# Patient Record
Sex: Male | Born: 1985 | Race: Black or African American | Hispanic: No
Health system: Southern US, Community
[De-identification: ages and names within clinical notes are randomized; demographics above are authoritative.]

## PROBLEM LIST (undated history)

## (undated) DIAGNOSIS — Z973 Presence of spectacles and contact lenses: Secondary | ICD-10-CM

## (undated) DIAGNOSIS — K4090 Unilateral inguinal hernia, without obstruction or gangrene, not specified as recurrent: Secondary | ICD-10-CM

## (undated) DIAGNOSIS — F909 Attention-deficit hyperactivity disorder, unspecified type: Secondary | ICD-10-CM

## (undated) DIAGNOSIS — L309 Dermatitis, unspecified: Secondary | ICD-10-CM

## (undated) DIAGNOSIS — R519 Headache, unspecified: Secondary | ICD-10-CM

## (undated) HISTORY — DX: Presence of spectacles and contact lenses: Z97.3

## (undated) HISTORY — DX: Headache, unspecified: R51.9

---

## 2011-02-14 ENCOUNTER — Inpatient Hospital Stay (INDEPENDENT_AMBULATORY_CARE_PROVIDER_SITE_OTHER)
Admission: RE | Admit: 2011-02-14 | Discharge: 2011-02-14 | Disposition: A | Discharge status: Home / Self Care | Payer: BLUE CROSS/BLUE SHIELD | Source: Ambulatory Visit | Attending: Emergency Medicine | Admitting: Emergency Medicine

## 2011-02-14 DIAGNOSIS — L299 Pruritus, unspecified: Secondary | ICD-10-CM

## 2011-06-19 ENCOUNTER — Emergency Department (HOSPITAL_COMMUNITY): Payer: No Typology Code available for payment source

## 2011-06-19 ENCOUNTER — Emergency Department (HOSPITAL_COMMUNITY)
Admission: EM | Admit: 2011-06-19 | Discharge: 2011-06-20 | Disposition: A | Discharge status: Home / Self Care | Payer: No Typology Code available for payment source | Attending: Emergency Medicine | Admitting: Emergency Medicine

## 2011-06-19 DIAGNOSIS — Z79899 Other long term (current) drug therapy: Secondary | ICD-10-CM | POA: Insufficient documentation

## 2011-06-19 DIAGNOSIS — M62838 Other muscle spasm: Secondary | ICD-10-CM | POA: Insufficient documentation

## 2011-06-19 DIAGNOSIS — M542 Cervicalgia: Secondary | ICD-10-CM | POA: Insufficient documentation

## 2011-06-19 DIAGNOSIS — S9000XA Contusion of unspecified ankle, initial encounter: Secondary | ICD-10-CM | POA: Insufficient documentation

## 2011-06-19 DIAGNOSIS — S139XXA Sprain of joints and ligaments of unspecified parts of neck, initial encounter: Secondary | ICD-10-CM | POA: Insufficient documentation

## 2011-06-19 DIAGNOSIS — R51 Headache: Secondary | ICD-10-CM | POA: Insufficient documentation

## 2011-06-19 DIAGNOSIS — M25579 Pain in unspecified ankle and joints of unspecified foot: Secondary | ICD-10-CM | POA: Insufficient documentation

## 2011-06-25 ENCOUNTER — Inpatient Hospital Stay (INDEPENDENT_AMBULATORY_CARE_PROVIDER_SITE_OTHER)
Admission: RE | Admit: 2011-06-25 | Discharge: 2011-06-25 | Disposition: A | Discharge status: Home / Self Care | Payer: Self-pay | Source: Ambulatory Visit | Attending: Family Medicine | Admitting: Family Medicine

## 2011-06-25 DIAGNOSIS — S93409A Sprain of unspecified ligament of unspecified ankle, initial encounter: Secondary | ICD-10-CM

## 2012-09-21 ENCOUNTER — Emergency Department (HOSPITAL_COMMUNITY)
Admission: EM | Admit: 2012-09-21 | Discharge: 2012-09-21 | Disposition: A | Discharge status: Home / Self Care | Payer: Self-pay

## 2012-10-16 ENCOUNTER — Emergency Department (HOSPITAL_COMMUNITY)
Admission: EM | Admit: 2012-10-16 | Discharge: 2012-10-17 | Disposition: A | Discharge status: Home / Self Care | Payer: Worker's Compensation | Attending: Emergency Medicine | Admitting: Emergency Medicine

## 2012-10-16 ENCOUNTER — Encounter (HOSPITAL_COMMUNITY): Payer: Self-pay | Admitting: Emergency Medicine

## 2012-10-16 DIAGNOSIS — S7000XA Contusion of unspecified hip, initial encounter: Secondary | ICD-10-CM | POA: Insufficient documentation

## 2012-10-16 DIAGNOSIS — Z79899 Other long term (current) drug therapy: Secondary | ICD-10-CM | POA: Insufficient documentation

## 2012-10-16 DIAGNOSIS — W1800XA Striking against unspecified object with subsequent fall, initial encounter: Secondary | ICD-10-CM

## 2012-10-16 DIAGNOSIS — W1809XA Striking against other object with subsequent fall, initial encounter: Secondary | ICD-10-CM | POA: Insufficient documentation

## 2012-10-16 DIAGNOSIS — F909 Attention-deficit hyperactivity disorder, unspecified type: Secondary | ICD-10-CM | POA: Insufficient documentation

## 2012-10-16 DIAGNOSIS — S7001XA Contusion of right hip, initial encounter: Secondary | ICD-10-CM

## 2012-10-16 DIAGNOSIS — Y990 Civilian activity done for income or pay: Secondary | ICD-10-CM | POA: Insufficient documentation

## 2012-10-16 DIAGNOSIS — Y9289 Other specified places as the place of occurrence of the external cause: Secondary | ICD-10-CM | POA: Insufficient documentation

## 2012-10-16 HISTORY — DX: Attention-deficit hyperactivity disorder, unspecified type: F90.9
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
## 2012-10-16 NOTE — ED Notes (Signed)
Pt now in waiting room.  States he went to car to change shoes.

## 2012-10-16 NOTE — ED Notes (Signed)
Pt not answering.  Called in waiting room multiple times.

## 2012-10-16 NOTE — ED Notes (Signed)
No answer when called to be triaged 

## 2012-10-17 MED ORDER — IBUPROFEN 400 MG PO TABS
800.0000 mg | ORAL_TABLET | Freq: Once | ORAL | Status: AC
  Administered 2012-10-17: 800 mg via ORAL
  Filled 2012-10-17: qty 2

## 2012-10-17 NOTE — ED Provider Notes (Signed)
Medical screening examination/treatment/procedure(s) were performed by non-physician practitioner and as supervising physician I was immediately available for consultation/collaboration.    Vida Roller, MD 10/17/12 [phone]
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
## 2012-10-19 ENCOUNTER — Emergency Department (HOSPITAL_COMMUNITY)
Admission: EM | Admit: 2012-10-19 | Discharge: 2012-10-19 | Disposition: A | Discharge status: Home / Self Care | Payer: Worker's Compensation | Attending: Emergency Medicine | Admitting: Emergency Medicine

## 2012-10-19 ENCOUNTER — Encounter (HOSPITAL_COMMUNITY): Payer: Self-pay | Admitting: Emergency Medicine

## 2012-10-19 DIAGNOSIS — Z79899 Other long term (current) drug therapy: Secondary | ICD-10-CM | POA: Insufficient documentation

## 2012-10-19 DIAGNOSIS — F909 Attention-deficit hyperactivity disorder, unspecified type: Secondary | ICD-10-CM | POA: Insufficient documentation

## 2012-10-19 DIAGNOSIS — G44309 Post-traumatic headache, unspecified, not intractable: Secondary | ICD-10-CM | POA: Insufficient documentation

## 2012-10-19 DIAGNOSIS — W19XXXA Unspecified fall, initial encounter: Secondary | ICD-10-CM

## 2012-10-19 DIAGNOSIS — Z87828 Personal history of other (healed) physical injury and trauma: Secondary | ICD-10-CM | POA: Insufficient documentation

## 2012-10-19 DIAGNOSIS — Z9181 History of falling: Secondary | ICD-10-CM | POA: Insufficient documentation

## 2012-10-19 DIAGNOSIS — G8911 Acute pain due to trauma: Secondary | ICD-10-CM | POA: Insufficient documentation
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
## 2012-10-19 NOTE — ED Provider Notes (Signed)
History   This chart was scribed for Doug Sou, MD by Charolett Bumpers, ED Scribe. The patient was seen in room TR08C/TR08C. Patient's care was started at 0820.   CSN: [id number]  Arrival date & time 10/19/12  0803   First MD Initiated Contact with Patient 10/19/12 0820      No chief complaint on file.   The history is provided by the patient. No language interpreter was used.  Victor Stevens is a 26 y.o. male who presents to the Emergency Department complaining of constant, moderate neck pain with associated back pain and a headache. Neck pain is left-sided paracervical, nonradiating He states that he slipped and fell 3 days ago at work. He states that he was seen in the ED afterwards. He still complains of persistent, neck pain, back pain has resolved. and a headache since the fall, described as soreness. He states he was told to return for continued pain. He denies any other complaints at this time. He states he has taken Ibuprofen with mild relief. He denies any LOC. He denies tobacco, drug or alcohol use. He reports that his condition his overall improving gradually however he was asked to come back if he continues to have pain.  Past Medical History  Diagnosis Date  . ADHD (attention deficit hyperactivity disorder)     No past surgical history on file.  No family history on file.  History  Substance Use Topics  . Smoking status: Never Smoker   . Smokeless tobacco: Not on file  . Alcohol Use: No      Review of Systems  Constitutional: Negative.   HENT: Positive for neck pain.   Respiratory: Negative.   Cardiovascular: Negative.   Gastrointestinal: Negative.   Skin: Negative.   Neurological: Positive for headaches.  Hematological: Negative.   Psychiatric/Behavioral: Negative.   All other systems reviewed and are negative.    Allergies  Other  Home Medications   Current Outpatient Rx  Name  Route  Sig  Dispense  Refill  .  AMPHETAMINE-DEXTROAMPHETAMINE 5 MG PO TABS   Oral   Take 5 mg by mouth 2 (two) times daily.         . IBUPROFEN 200 MG PO TABS   Oral   Take 400-600 mg by mouth every 6 (six) hours as needed. For pain           There were no vitals taken for this visit.  Physical Exam  Nursing note and vitals reviewed. Constitutional: He is oriented to person, place, and time. He appears well-developed and well-nourished.  HENT:  Head: Normocephalic and atraumatic.  Eyes: Conjunctivae normal are normal. Pupils are equal, round, and reactive to light.  Neck: Neck supple. No tracheal deviation present. No thyromegaly present.  Cardiovascular: Normal rate and regular rhythm.   No murmur heard. Pulmonary/Chest: Effort normal and breath sounds normal.  Abdominal: Soft. Bowel sounds are normal. He exhibits no distension. There is no tenderness.  Musculoskeletal: Normal range of motion. He exhibits no edema and no tenderness.       Entire spine nontender  Neurological: He is alert and oriented to person, place, and time. He has normal reflexes. Coordination normal.       Gaitnormal  Skin: Skin is warm and dry. No rash noted.  Psychiatric: He has a normal mood and affect.    ED Course  Procedures (including critical care time)  DIAGNOSTIC STUDIES: Oxygen Saturation is 97% on room air, adequate by my interpretation.  COORDINATION OF CARE:  08:25-Discussed planned course of treatment with the patient including continuing with Ibuprofen, and Tylenol, who is agreeable at this time. Pt drove himself to ED.     Labs Reviewed - No data to display No results found.   No diagnosis found.    MDM  X-rays not indicated discussed with patient patient agrees. Patient is experiencing soreness from fall. He can continue to take ibuprofen or Tylenol as needed. Note for no lifting 3 days more than 10 pounds. He should follow up with his workman's comp clinic if continued pain after 3 days prior to  return to work full duty Diagnosis #1 fall #2 neck pain #3 back pain #4 minor closed head injury    I personally performed the services described in this documentation, which was scribed in my presence. The recorded information has been reviewed and is accurate.     Doug Sou, MD 10/19/12 [phone]
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
## 2014-02-07 ENCOUNTER — Emergency Department (HOSPITAL_COMMUNITY)
Admission: EM | Admit: 2014-02-07 | Discharge: 2014-02-07 | Disposition: A | Discharge status: Home / Self Care | Payer: BC Managed Care – PPO | Source: Home / Self Care | Attending: Emergency Medicine | Admitting: Emergency Medicine

## 2014-02-07 ENCOUNTER — Other Ambulatory Visit (HOSPITAL_COMMUNITY)
Admission: RE | Admit: 2014-02-07 | Discharge: 2014-02-07 | Disposition: A | Discharge status: Home / Self Care | Payer: BC Managed Care – PPO | Source: Ambulatory Visit | Attending: Emergency Medicine | Admitting: Emergency Medicine

## 2014-02-07 ENCOUNTER — Encounter (HOSPITAL_COMMUNITY): Payer: Self-pay | Admitting: Emergency Medicine

## 2014-02-07 DIAGNOSIS — Q5310 Unspecified undescended testicle, unilateral: Secondary | ICD-10-CM

## 2014-02-07 DIAGNOSIS — N342 Other urethritis: Secondary | ICD-10-CM

## 2014-02-07 DIAGNOSIS — N453 Epididymo-orchitis: Secondary | ICD-10-CM

## 2014-02-07 DIAGNOSIS — N451 Epididymitis: Secondary | ICD-10-CM

## 2014-02-07 DIAGNOSIS — Q539 Undescended testicle, unspecified: Secondary | ICD-10-CM

## 2014-02-07 DIAGNOSIS — Z113 Encounter for screening for infections with a predominantly sexual mode of transmission: Secondary | ICD-10-CM | POA: Insufficient documentation

## 2014-02-07 LAB — POCT URINALYSIS DIP (DEVICE)
Bilirubin Urine: NEGATIVE
GLUCOSE, UA: NEGATIVE mg/dL
HGB URINE DIPSTICK: NEGATIVE
Ketones, ur: NEGATIVE mg/dL
NITRITE: NEGATIVE
PH: 7 (ref 5.0–8.0)
Protein, ur: NEGATIVE mg/dL
SPECIFIC GRAVITY, URINE: 1.025 (ref 1.005–1.030)
UROBILINOGEN UA: 1 mg/dL (ref 0.0–1.0)

## 2014-02-07 LAB — RPR: RPR Ser Ql: NONREACTIVE

## 2014-02-07 MED ORDER — LIDOCAINE HCL (PF) 1 % IJ SOLN
INTRAMUSCULAR | Status: AC
  Filled 2014-02-07: qty 5

## 2014-02-07 MED ORDER — CEFTRIAXONE SODIUM 250 MG IJ SOLR
INTRAMUSCULAR | Status: AC
  Filled 2014-02-07: qty 250

## 2014-02-07 MED ORDER — CEFTRIAXONE SODIUM 250 MG IJ SOLR
250.0000 mg | Freq: Once | INTRAMUSCULAR | Status: AC
  Administered 2014-02-07: 250 mg via INTRAMUSCULAR

## 2014-02-07 MED ORDER — DOXYCYCLINE HYCLATE 100 MG PO CAPS: 100.0000 mg | ORAL_CAPSULE | Freq: Two times a day (BID) | ORAL | Status: DC

## 2014-02-07 NOTE — ED Notes (Signed)
Reports penile discharge since Saturday.  Denies pelvic or abdominal pain.

## 2014-02-07 NOTE — ED Notes (Signed)
Pt given injection will discharge at 9:00 a.m

## 2014-02-07 NOTE — Discharge Instructions (Signed)
Undescended Testicle Undescended testicles (cryptorchidism) is the absence of one or both testicles from the scrotum. During development, the testicles of a male fetus form inside the abdomen. At about 28 weeks of gestation, the testicles descend from the abdomen, through a tube-like space between the muscles in the groin (inguinal canal), into the scrotum. Sometimes the testicles do not descend or only descend into the inguinal canal but not the scrotum (partially descended). Most of the time undescended testicles will descend within the first 4 months after birth. CAUSES Many things can cause testicles to not descend, including:  Decreased pressure in the abdomen.  Abnormal string that pulls the testis down.  Hormone abnormalities.  Scarring in the descent tube.  Abnormal muscle pull.  Abnormalities in the testicles and cord structures. RISK FACTORS Undescended testicles can be associated with:  Hernias.  Water sacs in the scrotum.  Abnormal development of the penis.  Cerebral palsy.  Mental retardation.  Down syndrome.  Tumors of the kidney. DIAGNOSIS  Undescended testicles are diagnosed by a physical exam.  TREATMENT  Treatment is important to decrease the chance of infertility. Sperm production can begin as early as 4212 months of age, so it is recommended that treatment occur by 28 year of age. Hormones can also be used to stimulate the testicles to descend into the scrotum. Sometimes surgery is required.  Document Released: 04/27/2003 Document Revised: 06/23/2013 Document Reviewed: 04/12/2013 [facility] Patient Information 2014 [place].   Sexually Transmitted Disease A sexually transmitted disease (STD) is a disease or infection that may be passed (transmitted) from person to person, usually during sexual activity. This may happen by way of saliva, semen, blood, vaginal mucus, or urine. Common STDs include:   Gonorrhea.   Chlamydia.   Syphilis.   HIV and  AIDS.   Genital herpes.   Hepatitis B and C.   Trichomonas.   Human papillomavirus (HPV).   Pubic lice.   Scabies.  Mites.  Bacterial vaginosis. WHAT ARE CAUSES OF STDs? An STD may be caused by bacteria, a virus, or parasites. STDs are often transmitted during sexual activity if one person is infected. However, they may also be transmitted through nonsexual means. STDs may be transmitted after:   Sexual intercourse with an infected person.   Sharing sex toys with an infected person.   Sharing needles with an infected person or using unclean piercing or tattoo needles.  Having intimate contact with the genitals, mouth, or rectal areas of an infected person.   Exposure to infected fluids during birth. WHAT ARE THE SIGNS AND SYMPTOMS OF STDs? Different STDs have different symptoms. Some people may not have any symptoms. If symptoms are present, they may include:   Painful or bloody urination.   Pain in the pelvis, abdomen, vagina, anus, throat, or eyes.   Skin rash, itching, irritation, growths, sores (lesions), ulcerations, or warts in the genital or anal area.  Abnormal vaginal discharge with or without bad odor.   Penile discharge in men.   Fever.   Pain or bleeding during sexual intercourse.   Swollen glands in the groin area.   Yellow skin and eyes (jaundice). This is seen with hepatitis.   Swollen testicles.  Infertility.  Sores and blisters in the mouth. HOW ARE STDs DIAGNOSED? To make a diagnosis, your health care provider may:   Take a medical history.   Perform a physical exam.   Take a sample of any discharge for examination.  Swab the throat, cervix, opening to the  penis, rectum, or vagina for testing.  Test a sample of your first morning urine.   Perform blood tests.   Perform a Pap smear, if this applies.   Perform a colposcopy.   Perform a laparoscopy.  HOW ARE STDs TREATED? Treatment depends on the STD.  Some STDs may be treated but not cured.   Chlamydia, gonorrhea, trichomonas, and syphilis can be cured with antibiotics.   Genital herpes, hepatitis, and HIV can be treated, but not cured, with prescribed medicines. The medicines lessen symptoms.   Genital warts from HPV can be treated with medicine or by freezing, burning (electrocautery), or surgery. Warts may come back.   HPV cannot be cured with medicine or surgery. However, abnormal areas may be removed from the cervix, vagina, or vulva.   If your diagnosis is confirmed, your recent sexual partners need treatment. This is true even if they are symptom-free or have a negative culture or evaluation. They should not have sex until their health care providers say it is OK. HOW CAN I REDUCE MY RISK OF GETTING AN STD?  Use latex condoms, dental dams, and water-soluble lubricants during sexual activity. Do not use petroleum jelly or oils.  Get vaccinated for HPV and hepatitis. If you have not received these vaccines in the past, talk to your health care provider about whether one or both might be right for you.   Avoid risky sex practices that can break the skin.  WHAT SHOULD I DO IF I THINK I HAVE AN STD?  See your health care provider.   Inform all sexual partners. They should be tested and treated for any STDs.  Do not have sex until your health care provider says it is OK. WHEN SHOULD I GET HELP? Seek immediate medical care if:  You develop severe abdominal pain.  You are a man and notice swelling or pain in the testicles.  You are a woman and notice swelling or pain in your vagina. Document Released: 01/11/2003 Document Revised: 08/11/2013 Document Reviewed: 05/11/2013 [facility] Patient Information 2014 [place], [place].  Urethritis, Adult Urethritis is an inflammation of the tube through which urine exits your bladder (urethra).  CAUSES Urethritis is often caused by an infection in your urethra. The infection can be  viral, like herpes. The infection can also be bacterial, like gonorrhea. RISK FACTORS Risk factors of urethritis include:  Having sex without using a condom.  Having multiple sexual partners.  Having poor hygiene. SIGNS AND SYMPTOMS Symptoms of urethritis are less noticeable in women than in men. These symptoms include:  Burning feeling when you urinate (dysuria).  Discharge from your urethra.  Blood in your urine (hematuria).  Urinating more than usual. DIAGNOSIS  To confirm a diagnosis of urethritis, your health care provider will do the following:  Ask about your sexual history.  Perform a physical exam.  Have you provide a sample of your urine for lab testing.  Use a cotton swab to gently collect a sample from your urethra for lab testing. TREATMENT  It is important to treat urethritis. Depending on the cause, untreated urethritis may lead to serious genital infections and possibly infertility. Urethritis caused by a bacterial infection is treated with antibiotics. All sexual partners must be treated.  HOME CARE INSTRUCTIONS  Do not have sex until the test results are known and treatment is completed, even if your symptoms go away before you finish treatment.  Finish all medicines that you are prescribed. SEEK MEDICAL CARE IF:   Your symptoms  are not improved in 3 days.  Your symptoms are getting worse.  You develop abdominal pain or pelvic pain (in women).  You develop joint pain. SEEK IMMEDIATE MEDICAL CARE IF:   You have a fever with a temperature of 101.26F (38.8C) or greater.  You have severe pain in the belly, back, or side.  You have repeated vomiting. Document Released: 04/16/2001 Document Revised: 08/11/2013 Document Reviewed: 06/21/2013 [facility] Patient Information 2014 [place], [place].

## 2014-02-07 NOTE — ED Provider Notes (Signed)
Medical screening examination/treatment/procedure(s) were performed by non-physician practitioner and as supervising physician I was immediately available for consultation/collaboration.  Leslee Homeavid Tamera Pingley, M.D.  Reuben Likesavid C Jamesa Tedrick, MD 02/07/14 [phone]

## 2014-02-07 NOTE — ED Provider Notes (Signed)
CSN: [id number]     Arrival date & time 02/07/14  0805 History   First MD Initiated Contact with Patient 02/07/14 0825     Chief Complaint  Patient presents with  . Penile Discharge   (Consider location/radiation/quality/duration/timing/severity/associated sxs/prior Treatment) HPI Comments: 28 year old male presents complaining of penile discharge for 2 days. He describes this as milky white. He denies any testicle pain or dysuria. He admits to risk factors for STDs. The discharge is worse in the morning. He denies any other symptoms.  Patient is a 28 y.o. male presenting with penile discharge.  Penile Discharge Pertinent negatives include no abdominal pain.    Past Medical History  Diagnosis Date  . ADHD (attention deficit hyperactivity disorder)    History reviewed. No pertinent past surgical history. History reviewed. No pertinent family history. History  Substance Use Topics  . Smoking status: Never Smoker   . Smokeless tobacco: Not on file  . Alcohol Use: No    Review of Systems  Constitutional: Negative for fever and chills.  Gastrointestinal: Negative for abdominal pain.  Genitourinary: Positive for discharge. Negative for dysuria and testicular pain.  Hematological: Negative for adenopathy.  All other systems reviewed and are negative.    Allergies  Other  Home Medications   Current Outpatient Rx  Name  Route  Sig  Dispense  Refill  . amphetamine-dextroamphetamine (ADDERALL) 5 MG tablet   Oral   Take 5 mg by mouth 2 (two) times daily.         Marland Kitchen doxycycline (VIBRAMYCIN) 100 MG capsule   Oral   Take 1 capsule (100 mg total) by mouth 2 (two) times daily.   14 capsule   0   . ibuprofen (ADVIL,MOTRIN) 200 MG tablet   Oral   Take 400-600 mg by mouth every 6 (six) hours as needed. For pain          BP 125/53  Pulse 70  Temp(Src) 98.2 F (36.8 C) (Oral)  Resp 20  SpO2 97% Physical Exam  Nursing note and vitals reviewed. Constitutional: He is  oriented to person, place, and time. He appears well-developed and well-nourished. No distress.  HENT:  Head: Normocephalic.  Pulmonary/Chest: Effort normal. No respiratory distress.  Genitourinary: Right testis shows tenderness (minimal tenderness at the epididymis). Right testis is descended. 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 No penile tenderness. Discharge found.  Lymphadenopathy:       Right: Inguinal adenopathy present.       Left: Inguinal adenopathy present.  Neurological: He is alert and oriented to person, place, and time. Coordination normal.  Skin: Skin is warm and dry. No rash noted. He is not diaphoretic.  Psychiatric: He has a normal mood and affect. Judgment normal.    ED Course  Procedures (including critical care time) Labs Review Labs Reviewed  POCT URINALYSIS DIP (DEVICE) - Abnormal; Notable for the following:    Leukocytes, UA SMALL (*)    All other components within normal limits  URINE CULTURE  RPR  HIV ANTIBODY (ROUTINE TESTING)  URINE CYTOLOGY ANCILLARY ONLY   Imaging Review No results found.   MDM   1. Urethritis   2. Epididymitis   3. Cryptorchidism, unilateral    Treating for STDs/epididymitis with Turner 50 mg of ceftriaxone here followed by doxycycline twice a day for a week.  With regard to the undescended testicle, he says he has been told in the past by his pediatrician that this is not a big deal and he shouldn't worry about. He needs  to have this evaluated and fixed if necessary in order to decrease risk of infertility or testicular cancer. I will refer him to urology for evaluation.  Meds ordered this encounter  Medications  . cefTRIAXone (ROCEPHIN) injection 250 mg    Sig:   . doxycycline (VIBRAMYCIN) 100 MG capsule    Sig: Take 1 capsule (100 mg total) by mouth 2 (two) times daily.    Dispense:  14 capsule    Refill:  0    Order Specific Question:  Supervising Provider    Answer:  Lorenz CoasterKELLER, DAVID C [[id number]]       Graylon GoodZachary H Saren Corkern,  PA-C 02/07/14 [phone]

## 2014-02-08 LAB — URINE CYTOLOGY ANCILLARY ONLY
Chlamydia: NEGATIVE
Neisseria Gonorrhea: POSITIVE — AB
TRICH (WINDOWPATH): NEGATIVE

## 2014-02-08 LAB — URINE CULTURE
COLONY COUNT: NO GROWTH
Culture: NO GROWTH

## 2014-02-09 ENCOUNTER — Telehealth (HOSPITAL_COMMUNITY): Payer: Self-pay | Admitting: *Deleted

## 2014-02-09 NOTE — ED Notes (Addendum)
GC pos., Chlamydia and Trich neg., RPR non-reactive, HIV pending, Urine culture: No growth. I called pt. Pt. verified x 2 and given results.  Pt. told he was adequately treated with Rocephin and Doxycycline and to finish all of his antibiotics.  I told him I would only call back if HIV is pos.  Pt. instructed to notify his partner, no sex for 1 week and to practice safe sex. Pt. told [facility] can [facility] by [facility]. Pt. voiced understanding.  [facility] Vassie MoselleSuzanne M Dick Hark 02/09/2014 I called [facility]. I told her that the HIV shows in process.  She had a final result and she faxed it me.  It was non-reactive.  02/10/2014

## 2014-02-10 LAB — HIV ANTIBODY (ROUTINE TESTING W REFLEX): HIV: NONREACTIVE

## 2014-11-17 ENCOUNTER — Other Ambulatory Visit (HOSPITAL_COMMUNITY)
Admission: RE | Admit: 2014-11-17 | Discharge: 2014-11-17 | Disposition: A | Discharge status: Home / Self Care | Payer: BLUE CROSS/BLUE SHIELD | Source: Ambulatory Visit | Attending: Family Medicine | Admitting: Family Medicine

## 2014-11-17 ENCOUNTER — Encounter (HOSPITAL_COMMUNITY): Payer: Self-pay | Admitting: Emergency Medicine

## 2014-11-17 ENCOUNTER — Emergency Department (HOSPITAL_COMMUNITY)
Admission: EM | Admit: 2014-11-17 | Discharge: 2014-11-17 | Disposition: A | Discharge status: Home / Self Care | Payer: BLUE CROSS/BLUE SHIELD | Source: Home / Self Care | Attending: Family Medicine | Admitting: Family Medicine

## 2014-11-17 DIAGNOSIS — Z113 Encounter for screening for infections with a predominantly sexual mode of transmission: Secondary | ICD-10-CM | POA: Diagnosis present

## 2014-11-17 DIAGNOSIS — N342 Other urethritis: Secondary | ICD-10-CM

## 2014-11-17 DIAGNOSIS — N5089 Other specified disorders of the male genital organs: Secondary | ICD-10-CM

## 2014-11-17 DIAGNOSIS — N508 Other specified disorders of male genital organs: Secondary | ICD-10-CM

## 2014-11-17 MED ORDER — AZITHROMYCIN 250 MG PO TABS
ORAL_TABLET | ORAL | Status: AC
  Filled 2014-11-17: qty 4

## 2014-11-17 MED ORDER — AZITHROMYCIN 250 MG PO TABS
1000.0000 mg | ORAL_TABLET | Freq: Every day | ORAL | Status: DC
  Administered 2014-11-17: 1000 mg via ORAL

## 2014-11-17 MED ORDER — CEFTRIAXONE SODIUM 250 MG IJ SOLR
INTRAMUSCULAR | Status: AC
  Filled 2014-11-17: qty 250

## 2014-11-17 MED ORDER — CEFTRIAXONE SODIUM 250 MG IJ SOLR
250.0000 mg | Freq: Once | INTRAMUSCULAR | Status: AC
  Administered 2014-11-17: 250 mg via INTRAMUSCULAR

## 2014-11-17 MED ORDER — LIDOCAINE HCL (PF) 1 % IJ SOLN
INTRAMUSCULAR | Status: AC
  Filled 2014-11-17: qty 5

## 2014-11-17 NOTE — ED Provider Notes (Signed)
CSN: [id number]     Arrival date & time 11/17/14  1236 History   First MD Initiated Contact with Patient 11/17/14 1310     Chief Complaint  Patient presents with  . Exposure to STD   (Consider location/radiation/quality/duration/timing/severity/associated sxs/prior Treatment) HPI           29 year old male presents complaining of possible STD. He has burning with urination and penile discharge for a few days. Denies testicular pain, abdominal pain, fever, chills. He admits to possible STD exposure  Past Medical History  Diagnosis Date  . ADHD (attention deficit hyperactivity disorder)    History reviewed. No pertinent past surgical history. History reviewed. No pertinent family history. History  Substance Use Topics  . Smoking status: Never Smoker   . Smokeless tobacco: Not on file  . Alcohol Use: Yes     Comment: once a year    Review of Systems  Constitutional: Negative for fever and chills.  Genitourinary: Positive for dysuria and discharge. Negative for penile swelling, genital sores and testicular pain.  All other systems reviewed and are negative.   Allergies  Other  Home Medications   Prior to Admission medications   Medication Sig Start Date End Date Taking? Authorizing Provider  amphetamine-dextroamphetamine (ADDERALL) 5 MG tablet Take 5 mg by mouth 2 (two) times daily.    Historical Provider, MD  doxycycline (VIBRAMYCIN) 100 MG capsule Take 1 capsule (100 mg total) by mouth 2 (two) times daily. 02/07/14   Adrian BlackwaterZachary H Yanuel Tagg, PA-C  ibuprofen (ADVIL,MOTRIN) 200 MG tablet Take 400-600 mg by mouth every 6 (six) hours as needed. For pain    Historical Provider, MD   BP 134/80 mmHg  Pulse 80  Temp(Src) 98.6 F (37 C) (Oral)  Resp 14  SpO2 100% Physical Exam  Constitutional: He is oriented to person, place, and time. He appears well-developed and well-nourished. No distress.  HENT:  Head: Normocephalic.  Pulmonary/Chest: Effort normal. No respiratory distress.   Abdominal: A hernia is present. Hernia confirmed positive in the left inguinal area (large, indirect).  Genitourinary: Right testis shows no swelling. Left testis shows mass (large scrotal mass, possibly indicating an indirect hernia) and swelling. Discharge found.  Lymphadenopathy:       Right: No inguinal adenopathy present.       Left: No inguinal adenopathy present.  Neurological: He is alert and oriented to person, place, and time. Coordination normal.  Skin: Skin is warm and dry. No rash noted. He is not diaphoretic.  Psychiatric: He has a normal mood and affect. Judgment normal.  Nursing note and vitals reviewed.   ED Course  Procedures (including critical care time) Labs Review Labs Reviewed  CYTOLOGY, (ORAL, ANAL, URETHRAL) ANCILLARY ONLY    Imaging Review No results found.   MDM   1. Urethritis   2. Scrotal mass    Cytology swab obtained.  Treated with rocephin and azithromycin.  F/u PRN   Meds ordered this encounter  Medications  . cefTRIAXone (ROCEPHIN) injection 250 mg    Sig:   . azithromycin (ZITHROMAX) tablet 1,000 mg    Sig:        Graylon GoodZachary H Bradley [place], PA-C 11/17/14 1438

## 2014-11-17 NOTE — ED Notes (Signed)
Pt states that he believes he was exposed to a STD

## 2014-11-17 NOTE — ED Notes (Signed)
Patient refused the blood tests for RPR & HIV

## 2014-11-17 NOTE — ED Notes (Signed)
Patient voided prior to coming to the exam room. Patient can't provide any specimens at this time.

## 2014-11-17 NOTE — Discharge Instructions (Signed)
Follow-up with your primary care provider for evaluation of the left scrotal mass   Urethritis Urethritis is an inflammation of the tube through which urine exits your bladder (urethra).  CAUSES Urethritis is often caused by an infection in your urethra. The infection can be viral, like herpes. The infection can also be bacterial, like gonorrhea. RISK FACTORS Risk factors of urethritis include:  Having sex without using a condom.  Having multiple sexual partners.  Having poor hygiene. SIGNS AND SYMPTOMS Symptoms of urethritis are less noticeable in women than in men. These symptoms include:  Burning feeling when you urinate (dysuria).  Discharge from your urethra.  Blood in your urine (hematuria).  Urinating more than usual. DIAGNOSIS  To confirm a diagnosis of urethritis, your health care provider will do the following:  Ask about your sexual history.  Perform a physical exam.  Have you provide a sample of your urine for lab testing.  Use a cotton swab to gently collect a sample from your urethra for lab testing. TREATMENT  It is important to treat urethritis. Depending on the cause, untreated urethritis may lead to serious genital infections and possibly infertility. Urethritis caused by a bacterial infection is treated with antibiotic medicine. All sexual partners must be treated.  HOME CARE INSTRUCTIONS  Do not have sex until the test results are known and treatment is completed, even if your symptoms go away before you finish treatment.  If you were prescribed an antibiotic, finish it all even if you start to feel better. SEEK MEDICAL CARE IF:   Your symptoms are not improved in 3 days.  Your symptoms are getting worse.  You develop abdominal pain or pelvic pain (in women).  You develop joint pain.  You have a fever. SEEK IMMEDIATE MEDICAL CARE IF:   You have severe pain in the belly, back, or side.  You have repeated vomiting. MAKE SURE  YOU:  Understand these instructions.  Will watch your condition.  Will get help right away if you are not doing well or get worse. Document Released: 04/16/2001 Document Revised: 03/07/2014 Document Reviewed: 06/21/2013 [facility] Patient Information 2015 [employer], [place]. This information is not intended to replace advice given to you by your health care provider. Make sure you discuss any questions you have with your health care provider.

## 2014-11-18 LAB — CYTOLOGY, (ORAL, ANAL, URETHRAL) ANCILLARY ONLY
CHLAMYDIA, DNA PROBE: NEGATIVE
NEISSERIA GONORRHEA: POSITIVE — AB
Trichomonas: NEGATIVE

## 2014-11-20 ENCOUNTER — Telehealth (HOSPITAL_COMMUNITY): Payer: Self-pay | Admitting: *Deleted

## 2014-11-23 NOTE — ED Notes (Addendum)
GC pos., Chlamydia and Trich neg.  Pt. adequately treated with Rocephin and Zithromax. I called pt. and left a message to call. Call 1.  [facility]. 11/20/2014 Left message.  Call 2. 11/23/2014 Left message.  Call 3. 11/24/2014 Unable to reach pt. by phone x 4.  Confidential marked letter sent with results and instructions.   Vassie MoselleYork, Andrik Sandt M 11/28/2014

## 2014-12-21 ENCOUNTER — Ambulatory Visit (INDEPENDENT_AMBULATORY_CARE_PROVIDER_SITE_OTHER): Payer: BLUE CROSS/BLUE SHIELD | Admitting: Physician Assistant

## 2014-12-21 VITALS — BP 110/82 | HR 68 | Temp 97.8°F | Resp 18 | Ht 65.0 in | Wt 168.0 lb

## 2014-12-21 DIAGNOSIS — R369 Urethral discharge, unspecified: Secondary | ICD-10-CM

## 2014-12-21 LAB — POCT UA - MICROSCOPIC ONLY
Bacteria, U Microscopic: NEGATIVE
Casts, Ur, LPF, POC: NEGATIVE
Crystals, Ur, HPF, POC: NEGATIVE
EPITHELIAL CELLS, URINE PER MICROSCOPY: NEGATIVE
MUCUS UA: NEGATIVE
RBC, URINE, MICROSCOPIC: NEGATIVE
YEAST UA: NEGATIVE

## 2014-12-21 LAB — POCT URINALYSIS DIPSTICK
BILIRUBIN UA: NEGATIVE
GLUCOSE UA: NEGATIVE
Ketones, UA: NEGATIVE
NITRITE UA: NEGATIVE
Protein, UA: NEGATIVE
RBC UA: NEGATIVE
SPEC GRAV UA: 1.02
UROBILINOGEN UA: 1
pH, UA: 7.5

## 2014-12-21 MED ORDER — AZITHROMYCIN 250 MG PO TABS: ORAL_TABLET | ORAL | Status: DC

## 2014-12-21 MED ORDER — CEFTRIAXONE SODIUM 1 G IJ SOLR
250.0000 mg | Freq: Once | INTRAMUSCULAR | Status: AC
  Administered 2014-12-21: 250 mg via INTRAMUSCULAR

## 2014-12-21 NOTE — Progress Notes (Signed)
   Subjective:    Patient ID: Victor JarvisJames Stevens, male    DOB: 01/01/1986, 29 y.o.   MRN: [id number]  Chief Complaint  Patient presents with  . std check  . Penile Discharge    x3 weeks    Prior to Admission medications   Medication Sig Start Date End Date Taking? Authorizing Provider  ibuprofen (ADVIL,MOTRIN) 200 MG tablet Take 400-600 mg by mouth every 6 (six) hours as needed. For pain   Yes Historical Provider, MD  amphetamine-dextroamphetamine (ADDERALL) 5 MG tablet Take 5 mg by mouth 2 (two) times daily.    Historical Provider, MD  azithromycin (ZITHROMAX) 250 MG tablet Please take all 4 tabs at one time. 12/21/14   Raelyn Ensignodd Tyheim Vanalstyne, PA   Medications, allergies, past medical history, surgical history, family history, social history and problem list reviewed and updated.  HPI  [age] yom with no pertinent pmh presents with white penile dc since yest.   Has hx gonorrhea infx one month ago, was tx at that time and sx resolved. Returns today with white dc that started last night. Denies dysuria, hematuria, increased freq, urgency.   He has had two male partners in the past few months, engaging in only oral sex with each partner. Oral sex has been unprotected. Last encounter was approx 10 days ago.   He tested negative for HIV/syphilis in 02/2104. Has never had Hep C testing.   Review of Systems No cp, sob, fever, chills.     Objective:   Physical Exam  Constitutional: He is oriented to person, place, and time. He appears well-developed and well-nourished.  Non-toxic appearance. He does not have a sickly appearance. He does not appear ill. No distress.  BP 110/82 mmHg  Pulse 68  Temp(Src) 97.8 F (36.6 C) (Oral)  Resp 18  Ht 5\' 5"  (1.651 m)  Wt 168 lb (76.204 kg)  BMI 27.96 kg/m2  SpO2 97%   Genitourinary: Discharge found.  Small amnt white dc from penile tip.   Neurological: He is alert and oriented to person, place, and time.  Psychiatric: He has a normal mood and affect. His speech  is normal and behavior is normal.      Assessment & Plan:   [age] yom with no pertinent pmh presents with white penile dc since yest.   Penile discharge - Plan: POCT UA - Microscopic Only, POCT urinalysis dipstick, GC/Chlamydia Probe Amp, cefTRIAXone (ROCEPHIN) injection 250 mg, azithromycin (ZITHROMAX) 250 MG tablet --presumptive tx for gc/ct --250 IM ceft today, 1g azithro sent to pharm --rtc 3 days if sx not resolved --rtc 3 months for recheck --pt declines hiv/rpr/hep c testing today --if uriprobe returns positive will contact health dept and inform pt to inform his partners  Donnajean Lopesodd M. Jahzeel Poythress, PA-C Physician Assistant-Certified [facility]  12/21/2014 11:06 AM

## 2014-12-21 NOTE — Patient Instructions (Signed)
We are testing you today for gonorrhea, chlamydia, and a urinary tract infection. I will let you know the results of those. We will treat you today as if you have those stds. You got one antibiotic shot in clinic. Stop by the pharmacy to pick up the other antibiotic, take all 4 pills at once.  If you change your mind about HIV/syphilis/Hepatitis C testing let us know. If you're symptoms aren't gone in 3 days please come back to clinic. Please come back to clinic in 3 months for recheck.

## 2014-12-22 LAB — GC/CHLAMYDIA PROBE AMP
CT Probe RNA: NEGATIVE
GC Probe RNA: POSITIVE — AB

## 2016-11-05 ENCOUNTER — Ambulatory Visit (HOSPITAL_COMMUNITY)
Admission: EM | Admit: 2016-11-05 | Discharge: 2016-11-05 | Disposition: A | Discharge status: Home / Self Care | Payer: BLUE CROSS/BLUE SHIELD | Attending: Family Medicine | Admitting: Family Medicine

## 2016-11-05 ENCOUNTER — Encounter (HOSPITAL_COMMUNITY): Payer: Self-pay | Admitting: Family Medicine

## 2016-11-05 DIAGNOSIS — A64 Unspecified sexually transmitted disease: Secondary | ICD-10-CM

## 2016-11-05 DIAGNOSIS — R369 Urethral discharge, unspecified: Secondary | ICD-10-CM | POA: Insufficient documentation

## 2016-11-05 DIAGNOSIS — Z79899 Other long term (current) drug therapy: Secondary | ICD-10-CM | POA: Diagnosis not present

## 2016-11-05 DIAGNOSIS — F909 Attention-deficit hyperactivity disorder, unspecified type: Secondary | ICD-10-CM | POA: Diagnosis not present

## 2016-11-05 MED ORDER — AZITHROMYCIN 250 MG PO TABS
1000.0000 mg | ORAL_TABLET | Freq: Once | ORAL | Status: AC
  Administered 2016-11-05: 1000 mg via ORAL

## 2016-11-05 MED ORDER — CEFTRIAXONE SODIUM 250 MG IJ SOLR
250.0000 mg | Freq: Once | INTRAMUSCULAR | Status: AC
  Administered 2016-11-05: 250 mg via INTRAMUSCULAR

## 2016-11-05 MED ORDER — AZITHROMYCIN 250 MG PO TABS
ORAL_TABLET | ORAL | Status: AC
  Filled 2016-11-05: qty 4

## 2016-11-05 MED ORDER — CEFTRIAXONE SODIUM 250 MG IJ SOLR
INTRAMUSCULAR | Status: AC
  Filled 2016-11-05: qty 250

## 2016-11-05 MED ORDER — LIDOCAINE HCL (PF) 1 % IJ SOLN
INTRAMUSCULAR | Status: AC
  Filled 2016-11-05: qty 2

## 2016-11-05 NOTE — Discharge Instructions (Signed)
You are being tested for, and treated for gonorrhea and chlamydia. You will be notified of the results of your tests. I recommend going to the health department or returning to clinic in 7 days for recheck. I also recommend using safe sex practices as well. Should symptoms fail to resolve or worsen follow up with a primary care provider or return to clinic.

## 2016-11-05 NOTE — ED Triage Notes (Signed)
Pt here for penile discharge that started today

## 2016-11-05 NOTE — ED Provider Notes (Signed)
CSN: [id number]     Arrival date & time 11/05/16  1914 History   First MD Initiated Contact with Patient 11/05/16 1948     Chief Complaint  Patient presents with  . Penile Discharge   (Consider location/radiation/quality/duration/timing/severity/associated sxs/prior Treatment) Patient reports he has had a creamy penile discharge today and last night, without pain and with no dysuria. He is sexually active and reports when he was with his partner the condom broke. He has no fever, abdominal pain, flank pain or systemic symptoms. He states there are no rashes, or lesions on his penis or genital area.   The history is provided by the patient.  Penile Discharge     Past Medical History:  Diagnosis Date  . ADHD (attention deficit hyperactivity disorder)    History reviewed. No pertinent surgical history. History reviewed. No pertinent family history. Social History  Substance Use Topics  . Smoking status: Never Smoker  . Smokeless tobacco: Never Used  . Alcohol use Yes     Comment: once a year    Review of Systems  Constitutional: Negative.   Cardiovascular: Negative.   Gastrointestinal: Negative.   Genitourinary: Positive for discharge. Negative for difficulty urinating, dysuria, flank pain, frequency, genital sores, penile pain, penile swelling, scrotal swelling, testicular pain and urgency.  Musculoskeletal: Negative.   Neurological: Negative.     Allergies  Other  Home Medications   Prior to Admission medications   Medication Sig Start Date End Date Taking? Authorizing Provider  amphetamine-dextroamphetamine (ADDERALL) 5 MG tablet Take 5 mg by mouth 2 (two) times daily.    Historical Provider, MD  azithromycin (ZITHROMAX) 250 MG tablet Please take all 4 tabs at one time. 12/21/14   Raelyn Ensignodd McVeigh, PA  ibuprofen (ADVIL,MOTRIN) 200 MG tablet Take 400-600 mg by mouth every 6 (six) hours as needed. For pain    Historical Provider, MD   Meds Ordered and Administered this Visit    Medications  azithromycin (ZITHROMAX) tablet 1,000 mg (not administered)  cefTRIAXone (ROCEPHIN) injection 250 mg (not administered)    BP 147/79 (BP Location: Right Arm)   Pulse 80   Temp 98.4 F (36.9 C) (Oral)   Resp 14   SpO2 99%  No data found.   Physical Exam  Constitutional: He is oriented to person, place, and time. He appears well-developed. No distress.  Cardiovascular: Normal rate and regular rhythm.   Pulmonary/Chest: Effort normal and breath sounds normal.  Abdominal: Soft. Bowel sounds are normal. He exhibits no distension. There is no tenderness. There is no guarding.  Genitourinary:  Genitourinary Comments: Deferred  Neurological: He is alert and oriented to person, place, and time.  Skin: Skin is warm and dry. Capillary refill takes less than 2 seconds. No rash noted. He is not diaphoretic. No erythema.  Psychiatric: He has a normal mood and affect.  Nursing note and vitals reviewed.   Urgent Care Course   Clinical Course     Procedures (including critical care time)  Labs Review Labs Reviewed  URINE CYTOLOGY ANCILLARY ONLY    Imaging Review No results found.   Visual Acuity Review  Right Eye Distance:   Left Eye Distance:   Bilateral Distance:    Right Eye Near:   Left Eye Near:    Bilateral Near:         MDM   1. STI (sexually transmitted infection)    Urine cytology collected and test ordered for GC/Chlamydia. Patient given rocephin and azithromycin in clinic. Patient advised to use  safe sex practices and to return to clinic or follow up with health department in 1 week for retest.     Dorena Bodo, NP 11/05/16 2034

## 2016-11-06 LAB — URINE CYTOLOGY ANCILLARY ONLY
Chlamydia: NEGATIVE
Neisseria Gonorrhea: POSITIVE — AB

## 2018-01-13 ENCOUNTER — Ambulatory Visit (HOSPITAL_COMMUNITY)
Admission: EM | Admit: 2018-01-13 | Discharge: 2018-01-13 | Disposition: A | Discharge status: Home / Self Care | Payer: BLUE CROSS/BLUE SHIELD | Attending: Nurse Practitioner | Admitting: Nurse Practitioner

## 2018-01-13 ENCOUNTER — Encounter (HOSPITAL_COMMUNITY): Payer: Self-pay | Admitting: Emergency Medicine

## 2018-01-13 DIAGNOSIS — J040 Acute laryngitis: Secondary | ICD-10-CM | POA: Diagnosis not present

## 2018-01-13 DIAGNOSIS — R490 Dysphonia: Secondary | ICD-10-CM | POA: Diagnosis not present

## 2018-01-13 NOTE — ED Provider Notes (Signed)
[facility]    CSN: [id number] Arrival date & time: 01/13/18  1454     History   Chief Complaint Chief Complaint  Patient presents with  . Hoarse    HPI Victor Stevens is a 32 y.o. male.   Subjective:   History was provided by the patient. Victor Stevens is a 32 y.o. male who presents for evaluation of hoarseness. Patient recently has upper respiratory/sinus symptoms. Onset of symptoms was 2 day ago and has been unchanged since that time.  Denies any fevers, sweats, chills, sore throat, congestion, ear pain, rhinorrhea, cough shortness of breath. He is drinking plenty of fluids. He has not had recent close exposure to someone with proven streptococcal pharyngitis.  The following portions of the patient's history were reviewed and updated as appropriate: allergies, current medications, past family history, past medical history, past social history, past surgical history and problem list.         Past Medical History:  Diagnosis Date  . ADHD (attention deficit hyperactivity disorder)     There are no active problems to display for this patient.   History reviewed. No pertinent surgical history.     Home Medications    Prior to Admission medications   Medication Sig Start Date End Date Taking? Authorizing Provider  amphetamine-dextroamphetamine (ADDERALL) 5 MG tablet Take 5 mg by mouth 2 (two) times daily.    [provider]  azithromycin (ZITHROMAX) 250 MG tablet Please take all 4 tabs at one time. 12/21/14   McVeigh, Tawanna Cooler, PA  ibuprofen (ADVIL,MOTRIN) 200 MG tablet Take 400-600 mg by mouth every 6 (six) hours as needed. For pain    [provider]    Family History No family history on file.  Social History Social History   Tobacco Use  . Smoking status: Never Smoker  . Smokeless tobacco: Never Used  Substance Use Topics  . Alcohol use: Yes    Comment: once a year  . Drug use: No     Allergies   Other   Review of  Systems Review of Systems  HENT: Positive for congestion and voice change. Negative for ear pain, postnasal drip, rhinorrhea, sinus pressure, sinus pain, sneezing, sore throat and trouble swallowing.   Respiratory: Negative.   Cardiovascular: Negative.   All other systems reviewed and are negative.    Physical Exam Triage Vital Signs ED Triage Vitals  Enc Vitals Group     BP 01/13/18 1640 129/84     Pulse Rate 01/13/18 1640 69     Resp 01/13/18 1640 16     Temp 01/13/18 1640 98 F (36.7 C)     Temp Source 01/13/18 1640 Oral     SpO2 01/13/18 1640 98 %     Weight 01/13/18 1641 170 lb (77.1 kg)     Height --      Head Circumference --      Peak Flow --      Pain Score 01/13/18 1641 0     Pain Loc --      Pain Edu? --      Excl. in GC? --    No data found.  Updated Vital Signs BP 129/84   Pulse 69   Temp 98 F (36.7 C) (Oral)   Resp 16   Wt 170 lb (77.1 kg)   SpO2 98%   BMI 28.29 kg/m   Visual Acuity Right Eye Distance:   Left Eye Distance:   Bilateral Distance:    Right Eye  Near:   Left Eye Near:    Bilateral Near:     Physical Exam  Constitutional: He is oriented to person, place, and time. He appears well-developed and well-nourished.  HENT:  Head: Normocephalic and atraumatic.  Right Ear: External ear normal.  Left Ear: External ear normal.  Nose: Nose normal.  Mouth/Throat: Oropharynx is clear and moist. No oropharyngeal exudate.  Eyes: Conjunctivae and EOM are normal. Pupils are equal, round, and reactive to light.  Neck: Normal range of motion. Neck supple. No JVD present. No tracheal deviation present. No thyromegaly present.  Cardiovascular: Normal rate and regular rhythm.  Pulmonary/Chest: Effort normal and breath sounds normal.  Musculoskeletal: Normal range of motion.  Lymphadenopathy:    He has no cervical adenopathy.  Neurological: He is alert and oriented to person, place, and time.  Skin: Skin is warm and dry.  Psychiatric: He has a  normal mood and affect.     UC Treatments / Results  Labs (all labs ordered are listed, but only abnormal results are displayed) Labs Reviewed - No data to display  EKG  EKG Interpretation None       Radiology No results found.  Procedures Procedures (including critical care time)  Medications Ordered in UC Medications - No data to display   Initial Impression / Assessment and Plan / UC Course  I have reviewed the triage vital signs and the nursing notes.  Pertinent labs & imaging results that were available during my care of the patient were reviewed by me and considered in my medical decision making (see chart for details).    32 year old male presenting with hoarseness for the past couple of days following to a upper respiratory tract infection. Patient's symptoms have resolved except the hoarseness. He denies any sore throat at this time. Provided reassurance. Advised patient to use OTC analgesics as well as salt water gargles. Hydration, humidification and voice rest. Follow up as needed.  Final Clinical Impressions(s) / UC Diagnoses   Final diagnoses:  Laryngitis, acute     ED Discharge Orders    None       Controlled Substance Prescriptions [place] Controlled Substance Registry consulted? Not Applicable   Lurline IdolMurrill, Yailen Zemaitis, [place]NP 01/13/18 1727

## 2018-01-13 NOTE — ED Triage Notes (Signed)
PT reports he "lost his voice" 2 days ago. Sinus pressure for the last week. Denies cough and sore throat.

## 2018-01-16 ENCOUNTER — Other Ambulatory Visit: Payer: Self-pay

## 2018-01-16 ENCOUNTER — Encounter (HOSPITAL_COMMUNITY): Payer: Self-pay

## 2018-01-16 DIAGNOSIS — Z79899 Other long term (current) drug therapy: Secondary | ICD-10-CM | POA: Diagnosis not present

## 2018-01-16 DIAGNOSIS — R42 Dizziness and giddiness: Secondary | ICD-10-CM | POA: Diagnosis not present

## 2018-01-16 DIAGNOSIS — J020 Streptococcal pharyngitis: Secondary | ICD-10-CM | POA: Insufficient documentation

## 2018-01-16 LAB — URINALYSIS, ROUTINE W REFLEX MICROSCOPIC
BACTERIA UA: NONE SEEN
Glucose, UA: NEGATIVE mg/dL
KETONES UR: 5 mg/dL — AB
LEUKOCYTES UA: NEGATIVE
Nitrite: NEGATIVE
Protein, ur: 30 mg/dL — AB
Specific Gravity, Urine: 1.038 — ABNORMAL HIGH (ref 1.005–1.030)
pH: 5 (ref 5.0–8.0)

## 2018-01-16 LAB — CBC
HEMATOCRIT: 42.7 % (ref 39.0–52.0)
Hemoglobin: 13.7 g/dL (ref 13.0–17.0)
MCH: 28.6 pg (ref 26.0–34.0)
MCHC: 32.1 g/dL (ref 30.0–36.0)
MCV: 89.1 fL (ref 78.0–100.0)
Platelets: 209 10*3/uL (ref 150–400)
RBC: 4.79 MIL/uL (ref 4.22–5.81)
RDW: 12.2 % (ref 11.5–15.5)
WBC: 12 10*3/uL — AB (ref 4.0–10.5)

## 2018-01-16 LAB — RAPID STREP SCREEN (MED CTR MEBANE ONLY): STREPTOCOCCUS, GROUP A SCREEN (DIRECT): NEGATIVE

## 2018-01-16 NOTE — ED Triage Notes (Signed)
Patient from home stating he has gotten dizzy for the last day.  Also complaining of a sore throat with a cough. Denies any fever or chills.

## 2018-01-17 ENCOUNTER — Emergency Department (HOSPITAL_COMMUNITY)
Admission: EM | Admit: 2018-01-17 | Discharge: 2018-01-17 | Disposition: A | Discharge status: Home / Self Care | Payer: BLUE CROSS/BLUE SHIELD | Attending: Emergency Medicine | Admitting: Emergency Medicine

## 2018-01-17 DIAGNOSIS — R42 Dizziness and giddiness: Secondary | ICD-10-CM

## 2018-01-17 DIAGNOSIS — J020 Streptococcal pharyngitis: Secondary | ICD-10-CM

## 2018-01-17 DIAGNOSIS — R369 Urethral discharge, unspecified: Secondary | ICD-10-CM

## 2018-01-17 DIAGNOSIS — J029 Acute pharyngitis, unspecified: Secondary | ICD-10-CM

## 2018-01-17 LAB — BASIC METABOLIC PANEL
ANION GAP: 9 (ref 5–15)
BUN: 9 mg/dL (ref 6–20)
CO2: 25 mmol/L (ref 22–32)
Calcium: 8.9 mg/dL (ref 8.9–10.3)
Chloride: 106 mmol/L (ref 101–111)
Creatinine, Ser: 1.07 mg/dL (ref 0.61–1.24)
GFR calc Af Amer: >60 mL/min (ref >60)
GFR calc non Af Amer: >60 mL/min (ref >60)
GLUCOSE: 105 mg/dL — AB (ref 65–99)
POTASSIUM: 4.4 mmol/L (ref 3.5–5.1)
Sodium: 140 mmol/L (ref 135–145)

## 2018-01-17 LAB — CBG MONITORING, ED: Glucose-Capillary: 89 mg/dL (ref 65–99)

## 2018-01-17 MED ORDER — BENZONATATE 100 MG PO CAPS
100.0000 mg | ORAL_CAPSULE | Freq: Once | ORAL | Status: AC
  Administered 2018-01-17: 100 mg via ORAL
  Filled 2018-01-17: qty 1

## 2018-01-17 MED ORDER — IBUPROFEN 600 MG PO TABS: 600.0000 mg | ORAL_TABLET | Freq: Four times a day (QID) | ORAL | 0 refills | Status: DC | PRN

## 2018-01-17 MED ORDER — BENZONATATE 100 MG PO CAPS: 100.0000 mg | ORAL_CAPSULE | Freq: Three times a day (TID) | ORAL | 0 refills | Status: DC

## 2018-01-17 MED ORDER — AZITHROMYCIN 250 MG PO TABS: ORAL_TABLET | ORAL | 0 refills | Status: DC

## 2018-01-17 MED ORDER — IBUPROFEN 800 MG PO TABS
800.0000 mg | ORAL_TABLET | Freq: Once | ORAL | Status: AC
  Administered 2018-01-17: 800 mg via ORAL
  Filled 2018-01-17: qty 1

## 2018-01-17 NOTE — ED Provider Notes (Signed)
[facility] EMERGENCY DEPARTMENT Provider Note   CSN: [id number] Arrival date & time: 01/16/18  2219     History   Chief Complaint Chief Complaint  Patient presents with  . Dizziness  . Sore Throat    HPI Victor Stevens is a 32 y.o. male.  HPI   32 year old male with history of ADHD presenting for evaluation of cold symptoms.  For the past 3 weeks patient has had mild headache, sinus congestion, sore throat, nonproductive cough, decrease in appetite, generalized weakness, dizziness and chills.  He denies fever, hemoptysis, shortness of breath, chest pain, abdominal pain, back pain, focal numbness or weakness, or rash he has tried over-the-counter medication without relief.  He was seen at urgent care 3 days ago for his symptoms.  He was told to use over-the-counter analgesics and gargle with salt water.  He tries to follow direction but states symptoms have not improved.  Denies any recent sick contact but states he is afraid that his symptoms can be contagious to his pregnant girlfriend.  Past Medical History:  Diagnosis Date  . ADHD (attention deficit hyperactivity disorder)     There are no active problems to display for this patient.   History reviewed. No pertinent surgical history.     Home Medications    Prior to Admission medications   Medication Sig Start Date End Date Taking? Authorizing Provider  amphetamine-dextroamphetamine (ADDERALL) 5 MG tablet Take 5 mg by mouth 2 (two) times daily.    [provider]  azithromycin (ZITHROMAX) 250 MG tablet Please take all 4 tabs at one time. 12/21/14   McVeigh, Tawanna Cooler, PA  ibuprofen (ADVIL,MOTRIN) 200 MG tablet Take 400-600 mg by mouth every 6 (six) hours as needed. For pain    [provider]    Family History History reviewed. No pertinent family history.  Social History Social History   Tobacco Use  . Smoking status: Never Smoker  . Smokeless tobacco: Never Used  Substance Use  Topics  . Alcohol use: Yes    Comment: once a year  . Drug use: No     Allergies   Other   Review of Systems Review of Systems  All other systems reviewed and are negative.    Physical Exam Updated Vital Signs BP (!) 137/95 (BP Location: Right Arm)   Pulse 98   Temp 99.1 F (37.3 C) (Oral)   Resp 18   SpO2 97%   Physical Exam  Constitutional: He is oriented to person, place, and time. He appears well-developed and well-nourished. No distress.  Nontoxic in appearance  HENT:  Head: Atraumatic.  Right Ear: Tympanic membrane normal.  Left Ear: Tympanic membrane normal.  Mouth/Throat: Uvula is midline, oropharynx is clear and moist and mucous membranes are normal. No posterior oropharyngeal edema or posterior oropharyngeal erythema.  Eyes: Conjunctivae are normal.  Neck: Normal range of motion. Neck supple.  No nuchal rigidity  Cardiovascular: Normal rate and regular rhythm.  Pulmonary/Chest: Effort normal and breath sounds normal. He has no wheezes.  Abdominal: Soft. There is no tenderness.  Neurological: He is alert and oriented to person, place, and time.  Skin: No rash noted.  Psychiatric: He has a normal mood and affect.  Nursing note and vitals reviewed.    ED Treatments / Results  Labs (all labs ordered are listed, but only abnormal results are displayed) Labs Reviewed  BASIC METABOLIC PANEL - Abnormal; Notable for the following components:      Result Value  Glucose, Bld 105 (*)    All other components within normal limits  CBC - Abnormal; Notable for the following components:   WBC 12.0 (*)    All other components within normal limits  URINALYSIS, ROUTINE W REFLEX MICROSCOPIC - Abnormal; Notable for the following components:   Specific Gravity, Urine 1.038 (*)    Hgb urine dipstick SMALL (*)    Bilirubin Urine MODERATE (*)    Ketones, ur 5 (*)    Protein, ur 30 (*)    Squamous Epithelial / LPF 0-5 (*)    All other components within normal limits    RAPID STREP SCREEN (NOT AT [facility])  CBG MONITORING, ED    EKG  EKG Interpretation None      Date: 01/17/2018  Rate: 91  Rhythm: normal sinus rhythm  QRS Axis: normal  Intervals: normal  ST/T Wave abnormalities: normal  Conduction Disutrbances: none  Narrative Interpretation:   Old EKG Reviewed: No significant changes noted     Radiology No results found.  Procedures Procedures (including critical care time)  Medications Ordered in ED Medications  benzonatate (TESSALON) capsule 100 mg (100 mg Oral Given 01/17/18 0330)  ibuprofen (ADVIL,MOTRIN) tablet 800 mg (800 mg Oral Given 01/17/18 0335)     Initial Impression / Assessment and Plan / ED Course  I have reviewed the triage vital signs and the nursing notes.  Pertinent labs & imaging results that were available during my care of the patient were reviewed by me and considered in my medical decision making (see chart for details).     BP 121/77 (BP Location: Right Arm)   Pulse 70   Temp 99.1 F (37.3 C) (Oral)   Resp 16   SpO2 100%    Final Clinical Impressions(s) / ED Diagnoses   Final diagnoses:  Dizziness  Sore throat  Strep pharyngitis    ED Discharge Orders    None     2:12 AM Patient with cold symptoms ongoing for the past 3 weeks.  He is still symptomatic.  He is afebrile, vital signs stable.  Does have mild elevated white count of 12.  Urine shows moderate bilirubin and 5 ketone with 30 protein however renal function is within normal limit.  He does not have any abdominal pain to suggest liver pathology.  Rapid strep test is negative.  4:06 AM Patient prescribed azithromycin to cover for potential superimposed bacterial infection due to his prolonged course of sickness.  Encourage patient to stay hydrated as his urine shows signs of dehydration.  Cough medication and ibuprofen prescribed.  Return precautions discussed.   Fayrene Helperran, Keon Pender, PA-C 01/17/18 0407    Geoffery Lyonselo, Douglas, MD 01/20/18 [phone]

## 2018-01-17 NOTE — Discharge Instructions (Signed)
Your symptoms are likely due to a viral infection.  However due to the prolonged sickness, please take antibiotic as prescribed to cover for potential superimposed bacterial infection.  Drink plenty of fluid and get plenty of rest.

## 2018-01-19 LAB — CULTURE, GROUP A STREP (THRC)

## 2019-01-12 ENCOUNTER — Encounter (HOSPITAL_COMMUNITY): Payer: Self-pay

## 2019-01-12 ENCOUNTER — Ambulatory Visit (HOSPITAL_COMMUNITY)
Admission: EM | Admit: 2019-01-12 | Discharge: 2019-01-12 | Disposition: A | Discharge status: Home / Self Care | Payer: BLUE CROSS/BLUE SHIELD | Attending: Family Medicine | Admitting: Family Medicine

## 2019-01-12 DIAGNOSIS — J069 Acute upper respiratory infection, unspecified: Secondary | ICD-10-CM

## 2019-01-12 MED ORDER — FLUTICASONE PROPIONATE 50 MCG/ACT NA SUSP: 2.0000 | Freq: Every day | NASAL | 0 refills | Status: DC

## 2019-01-12 MED ORDER — CETIRIZINE-PSEUDOEPHEDRINE ER 5-120 MG PO TB12: 1.0000 | ORAL_TABLET | Freq: Every day | ORAL | 0 refills | Status: DC

## 2019-01-12 NOTE — ED Triage Notes (Signed)
Pt c/o nasal congestion, sinus congestion and a sore throat x2 days

## 2019-01-12 NOTE — Discharge Instructions (Signed)
Get plenty of rest and push fluids Zyrtec-D prescribed for nasal congestion, runny nose, and/or sore throat Flonase prescribed for nasal congestion and runny nose Use medications daily for symptom relief Use OTC medications like ibuprofen or tylenol as needed fever or pain Follow up with PCP or with [facility] if symptoms persist Return or go to ER if you have any new or worsening symptoms fever, chills, nausea, vomiting, chest pain, cough, shortness of breath, wheezing, abdominal pain, changes in bowel or bladder habits, etc..Marland Kitchen

## 2019-01-12 NOTE — ED Provider Notes (Signed)
[facility] CARE CENTER   [id number] 01/12/19 Arrival Time: 1911   CC: URI symptoms   SUBJECTIVE: History from: patient.  Victor Stevens is a 33 y.o. male who presents with abrupt onset of nasal congestion, sinus HA, and sore throat x 48 hours.  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  Has tried OTC medications without relief.  Symptoms improved with taking a shower.  Reports previous symptoms in the past that improved with rest.   Denies fever, chills, fatigue, rhinorrhea, SOB, wheezing, chest pain, nausea, changes in bowel or bladder habits.     ROS: As per HPI.  Past Medical History:  Diagnosis Date  . ADHD (attention deficit hyperactivity disorder)    History reviewed. No pertinent surgical history. Allergies  Allergen Reactions  . Other Other (See Comments)    Seafood makes me nauseous   No current facility-administered medications on file prior to encounter.    Current Outpatient Medications on File Prior to Encounter  Medication Sig Dispense Refill  . ibuprofen (ADVIL,MOTRIN) 600 MG tablet Take 1 tablet (600 mg total) by mouth every 6 (six) hours as needed. 30 tablet 0   Social History   Socioeconomic History  . Marital status: Single    Spouse name: Not on file  . Number of children: Not on file  . Years of education: Not on file  . Highest education level: Not on file  Occupational History  . Not on file  Social Needs  . Financial resource strain: Not on file  . Food insecurity:    Worry: Not on file    Inability: Not on file  . Transportation needs:    Medical: Not on file    Non-medical: Not on file  Tobacco Use  . Smoking status: Never Smoker  . Smokeless tobacco: Never Used  Substance and Sexual Activity  . Alcohol use: Yes    Comment: once a year  . Drug use: No  . Sexual activity: Yes    Birth control/protection: None    Comment: Two partners in past 3 months, both only performed oral sex, no vaginal/anal  Lifestyle  . Physical  activity:    Days per week: Not on file    Minutes per session: Not on file  . Stress: Not on file  Relationships  . Social connections:    Talks on phone: Not on file    Gets together: Not on file    Attends religious service: Not on file    Active member of club or organization: Not on file    Attends meetings of clubs or organizations: Not on file    Relationship status: Not on file  . Intimate partner violence:    Fear of current or ex partner: Not on file    Emotionally abused: Not on file    Physically abused: Not on file    Forced sexual activity: Not on file  Other Topics Concern  . Not on file  Social History Narrative  . Not on file   No family history on file.  OBJECTIVE:  Vitals:   01/12/19 1939 01/12/19 1940  BP:  134/76  Pulse:  79  Resp:  18  Temp: 97.6 F (36.4 C)   TempSrc: Temporal   SpO2:  99%     General appearance: alert; appears mildly fatigued, but nontoxic; speaking in full sentences and tolerating own secretions HEENT: NCAT; Ears: EACs clear, TMs pearly gray; Eyes: PERRL.  EOM grossly intact. Sinuses: TTP over maxillary sinues;  Nose: nares patent without rhinorrhea, Throat: oropharynx clear, tonsils non erythematous or enlarged, uvula midline  Neck: supple without LAD Lungs: unlabored respirations, symmetrical air entry; cough: absent; no respiratory distress; CTAB Heart: regular rate and rhythm.  Radial pulses 2+ symmetrical bilaterally Skin: warm and dry Psychological: alert and cooperative; normal mood and affect  ASSESSMENT & PLAN:  1. Viral URI     Meds ordered this encounter  Medications  . cetirizine-pseudoephedrine (ZYRTEC-D) 5-120 MG tablet    Sig: Take 1 tablet by mouth daily.    Dispense:  30 tablet    Refill:  0    Order Specific Question:   Supervising Provider    Answer:   Eustace Moore [[id number]]  . fluticasone (FLONASE) 50 MCG/ACT nasal spray    Sig: Place 2 sprays into both nostrils daily.    Dispense:  16 g     Refill:  0    Order Specific Question:   Supervising Provider    Answer:   Eustace Moore [[id number]]    Get plenty of rest and push fluids Zyrtec-D prescribed for nasal congestion, runny nose, and/or sore throat Flonase prescribed for nasal congestion and runny nose Use medications daily for symptom relief Use OTC medications like ibuprofen or tylenol as needed fever or pain Follow up with PCP or with [facility] if symptoms persist Return or go to ER if you have any new or worsening symptoms fever, chills, nausea, vomiting, chest pain, cough, shortness of breath, wheezing, abdominal pain, changes in bowel or bladder habits, etc...  Reviewed expectations re: course of current medical issues. Questions answered. Outlined signs and symptoms indicating need for more acute intervention. Patient verbalized understanding. After Visit Summary given.         Rennis Harding, PA-C 01/12/19 2013

## 2019-05-22 DIAGNOSIS — Z20828 Contact with and (suspected) exposure to other viral communicable diseases: Secondary | ICD-10-CM | POA: Diagnosis not present

## 2019-11-05 DIAGNOSIS — Z20828 Contact with and (suspected) exposure to other viral communicable diseases: Secondary | ICD-10-CM | POA: Diagnosis not present

## 2019-11-05 HISTORY — PX: NO PAST SURGERIES: SHX2092

## 2019-11-23 ENCOUNTER — Other Ambulatory Visit: Payer: Self-pay

## 2019-11-23 ENCOUNTER — Encounter: Payer: Self-pay | Admitting: Medical

## 2019-11-23 ENCOUNTER — Ambulatory Visit (INDEPENDENT_AMBULATORY_CARE_PROVIDER_SITE_OTHER): Payer: BC Managed Care – PPO | Admitting: Medical

## 2019-11-23 VITALS — BP 122/78 | HR 90 | Temp 97.6°F | Ht 65.0 in | Wt 180.0 lb

## 2019-11-23 DIAGNOSIS — R0789 Other chest pain: Secondary | ICD-10-CM | POA: Diagnosis not present

## 2019-11-23 DIAGNOSIS — Z0000 Encounter for general adult medical examination without abnormal findings: Secondary | ICD-10-CM | POA: Diagnosis not present

## 2019-11-23 DIAGNOSIS — Z13220 Encounter for screening for lipoid disorders: Secondary | ICD-10-CM | POA: Diagnosis not present

## 2019-11-23 DIAGNOSIS — Z23 Encounter for immunization: Secondary | ICD-10-CM | POA: Diagnosis not present

## 2019-11-23 DIAGNOSIS — K4090 Unilateral inguinal hernia, without obstruction or gangrene, not specified as recurrent: Secondary | ICD-10-CM

## 2019-11-23 NOTE — Progress Notes (Signed)
Subjective:   HPI  Victor Stevens is a 34 y.o. male who presents for Chief Complaint  Patient presents with  . other    new pt. est. slight chest pain twice in 10 minutes 3 weeks ago but never since then    Patient Care Team: Jazir Newey, Leward Quan as PCP - General (Family Medicine) Sees dentist Sees eye doctor  Concerns: New patient today.  Referred by Johnnette Gourd  Here for physical and brief chest pain  He notes about 3 weeks ago he had 2 episodes of sharp brief chest pain in the left upper chest that lasted 5 seconds.  It went away and has not returned.  Is is out of the blue.  No associated shortness of breath, sweats, nausea, dizziness.  Exercises regularly without chest pain or shortness of breath or dizziness.  He does drink caffeine, drinks at least 1 soda per day, exercise regimen.  Planning to join the [employer] within the next year  He is non fasting today  He had a flu shot in November 2020  Reviewed their medical, surgical, family, social, medication, and allergy history and updated chart as appropriate.  Past Medical History:  Diagnosis Date  . ADHD (attention deficit hyperactivity disorder)   . Headache    occasional  . Wears glasses     Past Surgical History:  Procedure Laterality Date  . NO PAST SURGERIES  11/2019    Social History   Socioeconomic History  . Marital status: Single    Spouse name: Not on file  . Number of children: Not on file  . Years of education: Not on file  . Highest education level: Not on file  Occupational History  . Not on file  Tobacco Use  . Smoking status: Never Smoker  . Smokeless tobacco: Never Used  Substance and Sexual Activity  . Alcohol use: Yes    Comment: once a year  . Drug use: No  . Sexual activity: Yes    Birth control/protection: None    Comment: Two partners in past 3 months, both only performed oral sex, no vaginal/anal  Other Topics Concern  . 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 Difficulty of Paying Living Expenses: Not on file  Food Insecurity:   . Worried About Charity fundraiser in the Last Year: Not on file  . Ran Out of Food in the Last Year: Not on file  Transportation Needs:   . Lack of Transportation (Medical): Not on file  . Lack of Transportation (Non-Medical): Not on file  Physical Activity:   . Days of Exercise per Week: Not on file  . Minutes of Exercise per Session: Not on file  Stress:   . Feeling of Stress : Not on file  Social Connections:   . Frequency of Communication with Friends and Family: Not on file  . Frequency of Social Gatherings with Friends and Family: Not on file  . Attends Religious Services: Not on file  . Active Member of Clubs or Organizations: Not on file  . Attends Archivist Meetings: Not on file  . Marital Status: Not on file  Intimate Partner Violence:   . Fear of Current or Ex-Partner: Not on file  . Emotionally Abused: Not on file  . Physically  Abused: Not on file  . Sexually Abused: Not on file    Family History  Problem Relation Age of Onset  . Depression Father   . Cancer Maternal Aunt   . Heart disease Neg Hx   . Diabetes Neg Hx   . Stroke Neg Hx      Current Outpatient Medications:  .  ibuprofen (ADVIL,MOTRIN) 600 MG tablet, Take 1 tablet (600 mg total) by mouth every 6 (six) hours as needed., Disp: 30 tablet, Rfl: 0  Allergies  Allergen Reactions  . Other Other (See Comments)    Seafood makes me nauseous     Review of Systems Constitutional: -fever, -chills, -sweats, -unexpected weight change, -decreased appetite, -fatigue Allergy: -sneezing, -itching, -congestion Dermatology: -changing moles, --rash, -lumps ENT: -runny nose, -ear pain, -sore throat, -hoarseness, -sinus  pain, -teeth pain, - ringing in ears, -hearing loss, -nosebleeds Cardiology: +chest pain, -palpitations, -swelling, -difficulty breathing when lying flat, -waking up short of breath Respiratory: -cough, -shortness of breath, -difficulty breathing with exercise or exertion, -wheezing, -coughing up blood Gastroenterology: -abdominal pain, -nausea, -vomiting, -diarrhea, -constipation, -blood in stool, -changes in bowel movement, -difficulty swallowing or eating Hematology: -bleeding, -bruising  Musculoskeletal: -joint aches, -muscle aches, -joint swelling, -back pain, -neck pain, -cramping, -changes in gait Ophthalmology: denies vision changes, eye redness, itching, discharge Urology: -burning with urination, -difficulty urinating, -blood in urine, -urinary frequency, -urgency, -incontinence Neurology: -headache, -weakness, -tingling, -numbness, -memory loss, -falls, -dizziness Psychology: -depressed mood, -agitation, -sleep problems Male GU: no testicular mass, pain, no lymph nodes swollen, no swelling, no rash.     Objective:  BP 122/78   Pulse 90   Temp 97.6 F (36.4 C)   Ht 5\' 5"  (1.651 m)   Wt 180 lb (81.6 kg)   BMI 29.95 kg/m   General appearance: alert, no distress, WD/WN, African-American male Skin: Unremarkable  HEENT: normocephalic, conjunctiva/corneas normal, sclerae anicteric, PERRLA, EOMi, nares patent, no discharge or erythema, pharynx normal Oral cavity: MMM, tongue normal, teeth normal Neck: supple, no lymphadenopathy, no thyromegaly, no masses, normal ROM, no bruits Chest: non tender, normal shape and expansion Heart: RRR, normal S1, S2, no murmurs Lungs: CTA bilaterally, no wheezes, rhonchi, or rales Abdomen: +bs, soft, non tender, non distended, no masses, no hepatomegaly, no splenomegaly, no bruits Back: non tender, normal ROM, no scoliosis Musculoskeletal: upper extremities non tender, no obvious deformity, normal ROM throughout, lower extremities non tender, no  obvious deformity, normal ROM throughout Extremities: no edema, no cyanosis, no clubbing Pulses: 2+ symmetric, upper and lower extremities, normal cap refill Neurological: alert, oriented x 3, CN2-12 intact, strength normal upper extremities and lower extremities, sensation normal throughout, DTRs 2+ throughout, no cerebellar signs, gait normal Psychiatric: normal affect, behavior normal, pleasant  GU: normal male external genitalia, circumcised, large left non reducible inguinal hernia, nontender, no masses, no hernia, no lymphadenopathy Rectal: Deferred  Assessment and Plan :   Encounter Diagnoses  Name Primary?  . Encounter for health maintenance examination in adult Yes  . Chest discomfort   . Screening for lipid disorders   . Need for Tdap vaccination   . Unilateral inguinal hernia without obstruction or gangrene, recurrence not specified     Physical exam - discussed and counseled on healthy lifestyle, diet, exercise, preventative care, vaccinations, sick and well care, proper use of emergency dept and after hours care, and addressed their concerns.    Health screening: See your eye doctor yearly for routine vision care. See your dentist yearly for routine dental care including  hygiene visits twice yearly.  Cancer screening Advised monthly self testicular exam  Colonoscopy:  Age 45yo  Discussed PSA, prostate exam, and prostate cancer screening risks/benefits.  Age 76yo  Vaccinations: Advised yearly influenza vaccine He had a flu shot November 2020  He will return this Friday fasting for labs and for a Tdap vaccine.  Counseled on the risk and benefits of Tdap vaccine   Separate significant chronic issues discussed: We discussed his recent chest discomfort that was brief and nonspecific, atypical.  He will let me know if this continues or gets more frequent  Large left inguinal hernia-he will let me know if agreeable to referral at this point for general  surgery  Return in 2 days fasting for labs  Jarrod was seen today for other.  Diagnoses and all orders for this visit:  Encounter for health maintenance examination in adult -     Comprehensive metabolic panel; Future -     CBC with Differential/Platelet; Future -     Lipid panel; Future -     TSH; Future  Chest discomfort  Screening for lipid disorders  Need for Tdap vaccination  Unilateral inguinal hernia without obstruction or gangrene, recurrence not specified   Follow-up pending labs, yearly for physical

## 2019-11-26 ENCOUNTER — Other Ambulatory Visit (INDEPENDENT_AMBULATORY_CARE_PROVIDER_SITE_OTHER): Payer: BC Managed Care – PPO

## 2019-11-26 ENCOUNTER — Other Ambulatory Visit: Payer: Self-pay

## 2019-11-26 DIAGNOSIS — K4090 Unilateral inguinal hernia, without obstruction or gangrene, not specified as recurrent: Secondary | ICD-10-CM

## 2019-11-26 DIAGNOSIS — Z0000 Encounter for general adult medical examination without abnormal findings: Secondary | ICD-10-CM

## 2019-11-26 DIAGNOSIS — Z23 Encounter for immunization: Secondary | ICD-10-CM | POA: Diagnosis not present

## 2019-11-27 LAB — COMPREHENSIVE METABOLIC PANEL
ALT: 16 IU/L (ref 0–44)
AST: 15 IU/L (ref 0–40)
Albumin/Globulin Ratio: 1.9 (ref 1.2–2.2)
Albumin: 4.4 g/dL (ref 4.0–5.0)
Alkaline Phosphatase: 67 IU/L (ref 39–117)
BUN/Creatinine Ratio: 11 (ref 9–20)
BUN: 11 mg/dL (ref 6–20)
Bilirubin Total: 0.9 mg/dL (ref 0.0–1.2)
CO2: 25 mmol/L (ref 20–29)
Calcium: 9.2 mg/dL (ref 8.7–10.2)
Chloride: 105 mmol/L (ref 96–106)
Creatinine, Ser: 1 mg/dL (ref 0.76–1.27)
GFR calc Af Amer: 114 mL/min/{1.73_m2} (ref >59)
GFR calc non Af Amer: 98 mL/min/{1.73_m2} (ref >59)
Globulin, Total: 2.3 g/dL (ref 1.5–4.5)
Glucose: 89 mg/dL (ref 65–99)
Potassium: 4.4 mmol/L (ref 3.5–5.2)
Sodium: 143 mmol/L (ref 134–144)
Total Protein: 6.7 g/dL (ref 6.0–8.5)

## 2019-11-27 LAB — CBC WITH DIFFERENTIAL/PLATELET
Basophils Absolute: 0 10*3/uL (ref 0.0–0.2)
Basos: 1 %
EOS (ABSOLUTE): 0.1 10*3/uL (ref 0.0–0.4)
Eos: 2 %
Hematocrit: 41.3 % (ref 37.5–51.0)
Hemoglobin: 13.7 g/dL (ref 13.0–17.7)
Immature Grans (Abs): 0 10*3/uL (ref 0.0–0.1)
Immature Granulocytes: 0 %
Lymphocytes Absolute: 2.3 10*3/uL (ref 0.7–3.1)
Lymphs: 39 %
MCH: 28.5 pg (ref 26.6–33.0)
MCHC: 33.2 g/dL (ref 31.5–35.7)
MCV: 86 fL (ref 79–97)
Monocytes Absolute: 0.5 10*3/uL (ref 0.1–0.9)
Monocytes: 8 %
Neutrophils Absolute: 2.9 10*3/uL (ref 1.4–7.0)
Neutrophils: 50 %
Platelets: 197 10*3/uL (ref 150–450)
RBC: 4.8 x10E6/uL (ref 4.14–5.80)
RDW: 11.6 % (ref 11.6–15.4)
WBC: 5.8 10*3/uL (ref 3.4–10.8)

## 2019-11-27 LAB — LIPID PANEL
Chol/HDL Ratio: 2.3 ratio (ref 0.0–5.0)
Cholesterol, Total: 133 mg/dL (ref 100–199)
HDL: 57 mg/dL (ref >39)
LDL Chol Calc (NIH): 59 mg/dL (ref 0–99)
Triglycerides: 87 mg/dL (ref 0–149)
VLDL Cholesterol Cal: 17 mg/dL (ref 5–40)

## 2019-11-27 LAB — TSH: TSH: 1.41 u[IU]/mL (ref 0.450–4.500)

## 2019-11-29 ENCOUNTER — Telehealth: Payer: Self-pay | Admitting: Medical

## 2019-11-29 NOTE — Telephone Encounter (Signed)
Pt called back about light duty work note and he has questions about What he can and can not lift, he states he had someone tell him that he should not be lifting at all and he wants to [facility] what to do when he does have pain

## 2019-11-30 NOTE — Telephone Encounter (Signed)
Please let me know ASAP when his general surgery consult is scheduled?  I need to call him regarding his request on light duty.

## 2019-11-30 NOTE — Telephone Encounter (Signed)
Patient has been scheduled to see Dr. Cliffton Asters on feb 3 at 4 pm.

## 2019-12-02 ENCOUNTER — Encounter: Payer: Self-pay | Admitting: Medical

## 2019-12-02 ENCOUNTER — Telehealth: Payer: Self-pay | Admitting: Medical

## 2019-12-02 NOTE — Telephone Encounter (Signed)
Please write a work note stating he is to not lift anything over 40lb due to hernia.  Put on note he is being referred to general surgery for repair

## 2019-12-02 NOTE — Telephone Encounter (Signed)
done

## 2019-12-08 DIAGNOSIS — K4090 Unilateral inguinal hernia, without obstruction or gangrene, not specified as recurrent: Secondary | ICD-10-CM | POA: Diagnosis not present

## 2019-12-13 ENCOUNTER — Ambulatory Visit: Payer: Self-pay | Admitting: Surgery

## 2019-12-13 NOTE — H&P (Signed)
CC: Referred by PCP for left groin bulge  HPI: Victor Stevens is a very pleasant 27yoM with 15-38yr hx of left groin bulge that has steadily increased in size in the last couple of years. It has also become increasingly more symptomatic. He reports discomfort in the left groin particularly with lifting as well as coughing/sneezing. He denies history of constant unremitting pain or incarceration-like symptoms. He denies ever having had surgery before including in the groin. His discomfort is alleviated by modifying his activities or taking a break from lifting. He denies history of fever/chills/nausea/vomiting/abdominal bloating when he has had discomfort.  PMH: Denies any other health problems or health history  PSH: Denies any prior surgical history  FHx: Denies FHx of malignancy  Social: Denies use of tobacco/EtOH/drugs. He works at [employer] part-time as well as at [employer] where he does heavy lifting  ROS: A comprehensive 10 system review of systems was completed with the patient and pertinent findings as noted above.  The patient is a 34 year old male.   Past Surgical History Victor Stevens, Victor Stevens; 12/08/2019 3:56 PM) Foot Surgery  Left.  Diagnostic Studies History Victor Stevens, Victor Stevens; 12/08/2019 3:56 PM) Colonoscopy  never  Allergies Victor Stevens, Victor Stevens; 12/08/2019 3:57 PM) No Known Drug Allergies [12/08/2019]: Allergies Reconciled   Medication History Victor Stevens, [place]; 12/08/2019 3:57 PM) No Current Medications Medications Reconciled  Social History Victor Stevens, Victor Stevens; 12/08/2019 3:56 PM) No alcohol use  No caffeine use  No drug use  Tobacco use  Never smoker.  Family History Victor Stevens, [place]; 12/08/2019 3:56 PM) Alcohol Abuse  Family Members In General. Cancer  Family Members In General. Cerebrovascular Accident  Family Members In General. Depression  Family Members In General. Diabetes Mellitus  Family Members In General. Heart Disease  Family Members In  General. Heart disease in male family member before age 19  Hypertension  Family Members In General, Father, Mother. Malignant Neoplasm Of Pancreas  Family Members In General. Migraine Headache  Father, Mother. Seizure disorder  Family Members In General. Thyroid problems  Family Members In General.  Other Problems Victor Stevens, [place]; 12/08/2019 3:56 PM) Anxiety Disorder  Chest pain  Depression  Inguinal Hernia  Migraine Headache     Review of Systems Victor Stevens Victor Stevens; 12/08/2019 3:56 PM) General Present- Appetite Loss and Night Sweats. Not Present- Chills, Fatigue, Fever, Weight Gain and Weight Loss. HEENT Present- Seasonal Allergies and Wears glasses/contact lenses. Not Present- Earache, Hearing Loss, Hoarseness, Nose Bleed, Oral Ulcers, Ringing in the Ears, Sinus Pain, Sore Throat, Visual Disturbances and Yellow Eyes. Respiratory Present- Snoring. Not Present- Bloody sputum, Chronic Cough, Difficulty Breathing and Wheezing. Breast Not Present- Breast Mass, Breast Pain, Nipple Discharge and Skin Changes. Cardiovascular Not Present- Chest Pain, Difficulty Breathing Lying Down, Leg Cramps, Palpitations, Rapid Heart Rate, Shortness of Breath and Swelling of Extremities. Gastrointestinal Not Present- Abdominal Pain, Bloating, Bloody Stool, Change in Bowel Habits, Chronic diarrhea, Constipation, Difficulty Swallowing, Excessive gas, Gets full quickly at meals, Hemorrhoids, Indigestion, Nausea, Rectal Pain and Vomiting. Male Genitourinary Not Present- Blood in Urine, Change in Urinary Stream, Frequency, Impotence, Nocturia, Painful Urination, Urgency and Urine Leakage. Musculoskeletal Not Present- Back Pain, Joint Pain, Joint Stiffness, Muscle Pain, Muscle Weakness and Swelling of Extremities. Neurological Present- Headaches. Not Present- Decreased Memory, Fainting, Numbness, Seizures, Tingling, Tremor, Trouble walking and Weakness. Psychiatric Present- Fearful. Not Present-  Anxiety, Bipolar, Change in Sleep Pattern, Depression and Frequent crying. Endocrine Not Present- Cold Intolerance, Excessive Hunger, Hair Changes, Heat Intolerance, Hot flashes and  New Diabetes. Hematology Not Present- Blood Thinners, Easy Bruising, Excessive bleeding, Gland problems, HIV and Persistent Infections.  Vitals Victor Stevens Victor Stevens; 12/08/2019 3:58 PM) 12/08/2019 3:57 PM Weight: 189.4 lb Height: 65in Body Surface Area: 1.93 m Body Mass Index: 31.52 kg/m  Temp.: 45F(Tympanic)  Pulse: 73 (Regular)  BP: 126/72 (Sitting, Left Arm, Standard)       Physical Exam Victor Deer M. Meiah Zamudio MD; 12/08/2019 4:21 PM) The physical exam findings are as follows: Note:Constitutional: No acute distress; conversant; no deformities Eyes: Moist conjunctiva; no lid lag; anicteric sclerae; pupils equal round and reactive to light Neck: Trachea midline; no palpable thyromegaly Lungs: Normal respiratory effort; no tactile fremitus CV: rrr; no palpable thrill; no pitting edema GI: Abdomen soft, nontender, nondistended; no palpable hepatosplenomegaly. Left groin with reducible bulge and small scrotal component, +defect in inguinal floor. No rashes or skin changes. Right groin without palpable floor defect/hernia MSK: Normal gait; no clubbing/cyanosis Psychiatric: Appropriate affect; alert and oriented 3 Lymphatic: No palpable cervical or axillary lymphadenopathy    Assessment & Plan Victor Deer M. Carizma Dunsworth MD; 12/08/2019 4:25 PM) INGUINAL HERNIA (K40.90) Story: Victor Stevens is a very pleasant 33yoM with symptomatic reducible left inguinal hernia Impression: -The anatomy and physiology of the GI tract and abdominal wall was discussed at length with the patient with associated illustrations and his wife on speaker phone. The pathophysiology of hernias was discussed at length with associated pictures. -Given symptoms, we discussed proceeding with repair. We discussed open left inguinal hernia  repair with mesh. We discussed recurrence rates being much lower mesh is used as opposed to not used. -The planned procedure, material risks (including, but not limited to, pain, bleeding, infection, scarring, need for blood transfusion, damage to surrounding structures-blood vessels/nerves/viscus/organs, need for additional procedures, recurrence of hernia, chronic long-term pain in 1-2% of cases, mesh complications including erosion into other structures/vessels/organs/viscus, pneumonia, heart attack, stroke, death) benefits and alternatives to surgery were discussed at length. I noted a good probability that the procedure help improve his symptoms. The patient's questions were answered to his satisfaction, he voiced understanding and they elected to proceed with surgery. Additionally, we discussed typical postoperative expectations and the recovery process including the fact that he would be on light or modified duty with the lifting extraction of 10 pounds for 4-6 weeks after surgery. If no pain at 4 weeks with activity, gradually returning to all activity as tolerated; otherwise plans to wait the full 6 weeks. He expressed understanding.  This patient encounter took 35 minutes today to perform the following: take history, perform exam, review outside records, interpret imaging, counsel the patient on their diagnosis and document encounter, findings & plan in the EHR Current Plans Pt Education - [facility] Mesh education: discussed with patient and provided information. Signed by Andria Meuse, MD (12/08/2019 4:25 PM)

## 2019-12-20 ENCOUNTER — Other Ambulatory Visit (HOSPITAL_COMMUNITY): Payer: BLUE CROSS/BLUE SHIELD

## 2019-12-20 ENCOUNTER — Other Ambulatory Visit (HOSPITAL_COMMUNITY)
Admission: RE | Admit: 2019-12-20 | Discharge: 2019-12-20 | Disposition: A | Discharge status: Home / Self Care | Payer: BC Managed Care – PPO | Source: Ambulatory Visit | Attending: Surgery | Admitting: Surgery

## 2019-12-20 ENCOUNTER — Other Ambulatory Visit: Payer: Self-pay

## 2019-12-20 ENCOUNTER — Encounter (HOSPITAL_COMMUNITY)
Admission: RE | Admit: 2019-12-20 | Discharge: 2019-12-20 | Disposition: A | Discharge status: Home / Self Care | Payer: BC Managed Care – PPO | Source: Ambulatory Visit | Attending: Surgery | Admitting: Surgery

## 2019-12-20 DIAGNOSIS — Z01812 Encounter for preprocedural laboratory examination: Secondary | ICD-10-CM | POA: Diagnosis not present

## 2019-12-20 DIAGNOSIS — Z20822 Contact with and (suspected) exposure to covid-19: Secondary | ICD-10-CM | POA: Insufficient documentation

## 2019-12-20 LAB — CBC WITH DIFFERENTIAL/PLATELET
Abs Immature Granulocytes: 0.01 10*3/uL (ref 0.00–0.07)
Basophils Absolute: 0 10*3/uL (ref 0.0–0.1)
Basophils Relative: 1 %
Eosinophils Absolute: 0.1 10*3/uL (ref 0.0–0.5)
Eosinophils Relative: 2 %
HCT: 43.9 % (ref 39.0–52.0)
Hemoglobin: 13.6 g/dL (ref 13.0–17.0)
Immature Granulocytes: 0 %
Lymphocytes Relative: 33 %
Lymphs Abs: 1.8 10*3/uL (ref 0.7–4.0)
MCH: 28.5 pg (ref 26.0–34.0)
MCHC: 31 g/dL (ref 30.0–36.0)
MCV: 92 fL (ref 80.0–100.0)
Monocytes Absolute: 0.5 10*3/uL (ref 0.1–1.0)
Monocytes Relative: 8 %
Neutro Abs: 3.1 10*3/uL (ref 1.7–7.7)
Neutrophils Relative %: 56 %
Platelets: 180 10*3/uL (ref 150–400)
RBC: 4.77 MIL/uL (ref 4.22–5.81)
RDW: 11.9 % (ref 11.5–15.5)
WBC: 5.5 10*3/uL (ref 4.0–10.5)
nRBC: 0 % (ref 0.0–0.2)

## 2019-12-20 LAB — COMPREHENSIVE METABOLIC PANEL
ALT: 21 U/L (ref 0–44)
AST: 21 U/L (ref 15–41)
Albumin: 4.2 g/dL (ref 3.5–5.0)
Alkaline Phosphatase: 62 U/L (ref 38–126)
Anion gap: 7 (ref 5–15)
BUN: 13 mg/dL (ref 6–20)
CO2: 27 mmol/L (ref 22–32)
Calcium: 8.9 mg/dL (ref 8.9–10.3)
Chloride: 106 mmol/L (ref 98–111)
Creatinine, Ser: 1.12 mg/dL (ref 0.61–1.24)
GFR calc Af Amer: >60 mL/min (ref >60)
GFR calc non Af Amer: >60 mL/min (ref >60)
Glucose, Bld: 112 mg/dL — ABNORMAL HIGH (ref 70–99)
Potassium: 4.1 mmol/L (ref 3.5–5.1)
Sodium: 140 mmol/L (ref 135–145)
Total Bilirubin: 1.4 mg/dL — ABNORMAL HIGH (ref 0.3–1.2)
Total Protein: 7.2 g/dL (ref 6.5–8.1)

## 2019-12-20 LAB — PROTIME-INR
INR: 1 (ref 0.8–1.2)
Prothrombin Time: 13 seconds (ref 11.4–15.2)

## 2019-12-20 LAB — APTT: aPTT: 31 seconds (ref 24–36)

## 2019-12-20 LAB — SARS CORONAVIRUS 2 (TAT 6-24 HRS): SARS Coronavirus 2: NEGATIVE

## 2019-12-21 ENCOUNTER — Encounter (HOSPITAL_BASED_OUTPATIENT_CLINIC_OR_DEPARTMENT_OTHER): Payer: Self-pay | Admitting: Surgery

## 2019-12-21 ENCOUNTER — Other Ambulatory Visit: Payer: Self-pay

## 2019-12-21 NOTE — Progress Notes (Signed)
Spoke w/ via phone for pre-op interview---Victor Stevens needs dos----  none            Stevens results------cbc with dif, cmet, pt, ptt epic done 12-20-2019 epic COVID test ------12-20-2019 Arrive at -------530 am 12-23-2019 NPO after ------midnight Medications to take morning of surgery -----none Diabetic medication -----n/a Patient Special Instructions ----- Pre-Op special Istructions ----- Patient verbalized understanding of instructions that were given at this phone interview. Patient denies shortness of breath, chest pain, fever, cough a this phone interview.

## 2019-12-22 ENCOUNTER — Encounter (HOSPITAL_BASED_OUTPATIENT_CLINIC_OR_DEPARTMENT_OTHER): Payer: Self-pay | Admitting: Certified Registered"

## 2019-12-22 NOTE — Anesthesia Preprocedure Evaluation (Deleted)
Anesthesia Evaluation    Reviewed: Allergy & Precautions, Patient's Chart, lab work & pertinent test results  Airway        Dental   Pulmonary neg pulmonary ROS,           Cardiovascular negative cardio ROS       Neuro/Psych  Headaches, PSYCHIATRIC DISORDERS ADHD (attention deficit hyperactivity disorder)   GI/Hepatic negative GI ROS, Neg liver ROS,   Endo/Other  negative endocrine ROS  Renal/GU negative Renal ROS     Musculoskeletal negative musculoskeletal ROS (+)   Abdominal   Peds  Hematology negative hematology ROS (+)   Anesthesia Other Findings  INGUINAL HERNIA  Reproductive/Obstetrics                             Anesthesia Physical Anesthesia Plan  ASA: II  Anesthesia Plan: General   Post-op Pain Management:    Induction: Intravenous  PONV Risk Score and Plan: 3 and Midazolam, Dexamethasone, Ondansetron and Treatment may vary due to age or medical condition  Airway Management Planned: Oral ETT  Additional Equipment:   Intra-op Plan:   Post-operative Plan: Extubation in OR  Informed Consent:   Plan Discussed with:   Anesthesia Plan Comments:         Anesthesia Quick Evaluation

## 2019-12-23 ENCOUNTER — Ambulatory Visit (HOSPITAL_BASED_OUTPATIENT_CLINIC_OR_DEPARTMENT_OTHER): Admission: RE | Admit: 2019-12-23 | Payer: BC Managed Care – PPO | Source: Home / Self Care | Admitting: Surgery

## 2019-12-23 HISTORY — DX: Unilateral inguinal hernia, without obstruction or gangrene, not specified as recurrent: K40.90

## 2019-12-23 HISTORY — DX: Dermatitis, unspecified: L30.9

## 2019-12-23 SURGERY — REPAIR, HERNIA, INGUINAL, ADULT
Anesthesia: General | Laterality: Left

## 2020-01-08 DIAGNOSIS — Z20828 Contact with and (suspected) exposure to other viral communicable diseases: Secondary | ICD-10-CM | POA: Diagnosis not present

## 2020-01-13 ENCOUNTER — Ambulatory Visit: Payer: Self-pay | Admitting: Surgery

## 2020-01-14 ENCOUNTER — Encounter (HOSPITAL_BASED_OUTPATIENT_CLINIC_OR_DEPARTMENT_OTHER): Payer: Self-pay | Admitting: Surgery

## 2020-01-14 ENCOUNTER — Other Ambulatory Visit: Payer: Self-pay

## 2020-01-14 NOTE — Progress Notes (Signed)
Spoke w/ via phone for pre-op interview---[facility] needs dos----  none             COVID test ------01-15-2020 1230 pm Arrive at -------630 am 01-19-2020 NPO after ------midnight Medications to take morning of surgery -----none Diabetic medication -----n/a Patient Special Instructions ----- Pre-Op special Istructions ----- Patient verbalized understanding of instructions that were given at this phone interview. Patient denies shortness of breath, chest pain, fever, cough a this phone interview.

## 2020-01-15 ENCOUNTER — Other Ambulatory Visit (HOSPITAL_COMMUNITY)
Admission: RE | Admit: 2020-01-15 | Discharge: 2020-01-15 | Disposition: A | Discharge status: Home / Self Care | Payer: BC Managed Care – PPO | Source: Ambulatory Visit | Attending: Surgery | Admitting: Surgery

## 2020-01-15 DIAGNOSIS — Z20822 Contact with and (suspected) exposure to covid-19: Secondary | ICD-10-CM | POA: Insufficient documentation

## 2020-01-15 DIAGNOSIS — Z01812 Encounter for preprocedural laboratory examination: Secondary | ICD-10-CM | POA: Insufficient documentation

## 2020-01-15 LAB — SARS CORONAVIRUS 2 (TAT 6-24 HRS): SARS Coronavirus 2: NEGATIVE

## 2020-01-18 NOTE — Anesthesia Preprocedure Evaluation (Addendum)
Anesthesia Evaluation  Patient identified by MRN, date of birth, ID band Patient awake    Reviewed: Allergy & Precautions, NPO status , Patient's Chart, lab work & pertinent test results  Airway Mallampati: II  TM Distance: >3 FB Neck ROM: Full    Dental no notable dental hx. (+) Teeth Intact, Dental Advisory Given   Pulmonary neg pulmonary ROS,    Pulmonary exam normal breath sounds clear to auscultation       Cardiovascular negative cardio ROS Normal cardiovascular exam Rhythm:Regular Rate:Normal     Neuro/Psych  Headaches, PSYCHIATRIC DISORDERS    GI/Hepatic Neg liver ROS,   Endo/Other  negative endocrine ROS  Renal/GU negative Renal ROS     Musculoskeletal negative musculoskeletal ROS (+)   Abdominal   Peds  Hematology negative hematology ROS (+)   Anesthesia Other Findings   Reproductive/Obstetrics negative OB ROS                            Anesthesia Physical Anesthesia Plan  ASA: II  Anesthesia Plan: General   Post-op Pain Management:    Induction: Intravenous  PONV Risk Score and Plan: 3 and Treatment may vary due to age or medical condition, Ondansetron, Dexamethasone and Midazolam  Airway Management Planned: LMA  Additional Equipment: None  Intra-op Plan:   Post-operative Plan:   Informed Consent: I have reviewed the patients History and Physical, chart, labs and discussed the procedure including the risks, benefits and alternatives for the proposed anesthesia with the patient or authorized representative who has indicated his/her understanding and acceptance.     Dental advisory given  Plan Discussed with: CRNA  Anesthesia Plan Comments: (L inguinal hernia)       Anesthesia Quick Evaluation

## 2020-01-19 ENCOUNTER — Encounter (HOSPITAL_BASED_OUTPATIENT_CLINIC_OR_DEPARTMENT_OTHER): Payer: Self-pay | Admitting: Surgery

## 2020-01-19 ENCOUNTER — Encounter (HOSPITAL_BASED_OUTPATIENT_CLINIC_OR_DEPARTMENT_OTHER)
Admission: RE | Disposition: A | Discharge status: Home / Self Care | Payer: Self-pay | Source: Ambulatory Visit | Attending: Surgery

## 2020-01-19 ENCOUNTER — Ambulatory Visit (HOSPITAL_BASED_OUTPATIENT_CLINIC_OR_DEPARTMENT_OTHER): Payer: BC Managed Care – PPO | Admitting: Anesthesiology

## 2020-01-19 ENCOUNTER — Other Ambulatory Visit: Payer: Self-pay

## 2020-01-19 ENCOUNTER — Ambulatory Visit (HOSPITAL_BASED_OUTPATIENT_CLINIC_OR_DEPARTMENT_OTHER)
Admission: RE | Admit: 2020-01-19 | Discharge: 2020-01-19 | Disposition: A | Discharge status: Home / Self Care | Payer: BC Managed Care – PPO | Source: Ambulatory Visit | Attending: Surgery | Admitting: Surgery

## 2020-01-19 DIAGNOSIS — Z809 Family history of malignant neoplasm, unspecified: Secondary | ICD-10-CM | POA: Insufficient documentation

## 2020-01-19 DIAGNOSIS — K4030 Unilateral inguinal hernia, with obstruction, without gangrene, not specified as recurrent: Secondary | ICD-10-CM | POA: Diagnosis not present

## 2020-01-19 DIAGNOSIS — Z818 Family history of other mental and behavioral disorders: Secondary | ICD-10-CM | POA: Diagnosis not present

## 2020-01-19 DIAGNOSIS — F909 Attention-deficit hyperactivity disorder, unspecified type: Secondary | ICD-10-CM | POA: Insufficient documentation

## 2020-01-19 DIAGNOSIS — Q5313 Unilateral high scrotal testis: Secondary | ICD-10-CM | POA: Insufficient documentation

## 2020-01-19 DIAGNOSIS — Q5310 Unspecified undescended testicle, unilateral: Secondary | ICD-10-CM | POA: Diagnosis not present

## 2020-01-19 DIAGNOSIS — K4090 Unilateral inguinal hernia, without obstruction or gangrene, not specified as recurrent: Secondary | ICD-10-CM | POA: Diagnosis not present

## 2020-01-19 DIAGNOSIS — N500 Atrophy of testis: Secondary | ICD-10-CM | POA: Diagnosis not present

## 2020-01-19 DIAGNOSIS — Q539 Undescended testicle, unspecified: Secondary | ICD-10-CM | POA: Diagnosis not present

## 2020-01-19 DIAGNOSIS — G43909 Migraine, unspecified, not intractable, without status migrainosus: Secondary | ICD-10-CM | POA: Diagnosis not present

## 2020-01-19 DIAGNOSIS — Z91013 Allergy to seafood: Secondary | ICD-10-CM | POA: Insufficient documentation

## 2020-01-19 HISTORY — PX: INSERTION OF MESH: SHX5868

## 2020-01-19 HISTORY — PX: INGUINAL HERNIA REPAIR: SHX194

## 2020-01-19 HISTORY — PX: ORCHIOPEXY: SHX479

## 2020-01-19 SURGERY — REPAIR, HERNIA, INGUINAL, ADULT
Anesthesia: General | Site: Scrotum

## 2020-01-19 MED ORDER — FENTANYL CITRATE (PF) 100 MCG/2ML IJ SOLN
INTRAMUSCULAR | Status: AC
  Filled 2020-01-19: qty 2

## 2020-01-19 MED ORDER — ONDANSETRON HCL 4 MG/2ML IJ SOLN
4.0000 mg | Freq: Once | INTRAMUSCULAR | Status: DC | PRN
  Filled 2020-01-19: qty 2

## 2020-01-19 MED ORDER — KETOROLAC TROMETHAMINE 30 MG/ML IJ SOLN
INTRAMUSCULAR | Status: AC
  Filled 2020-01-19: qty 1

## 2020-01-19 MED ORDER — KETOROLAC TROMETHAMINE 30 MG/ML IJ SOLN
INTRAMUSCULAR | Status: DC | PRN
  Administered 2020-01-19: 30 mg via INTRAVENOUS

## 2020-01-19 MED ORDER — LIDOCAINE 2% (20 MG/ML) 5 ML SYRINGE
INTRAMUSCULAR | Status: AC
  Filled 2020-01-19: qty 5

## 2020-01-19 MED ORDER — MIDAZOLAM HCL 2 MG/2ML IJ SOLN
INTRAMUSCULAR | Status: AC
  Filled 2020-01-19: qty 2

## 2020-01-19 MED ORDER — GLYCOPYRROLATE PF 0.2 MG/ML IJ SOSY
PREFILLED_SYRINGE | INTRAMUSCULAR | Status: AC
  Filled 2020-01-19: qty 1

## 2020-01-19 MED ORDER — EPHEDRINE SULFATE-NACL 50-0.9 MG/10ML-% IV SOSY
PREFILLED_SYRINGE | INTRAVENOUS | Status: DC | PRN
  Administered 2020-01-19 (×3): 10 mg via INTRAVENOUS
  Administered 2020-01-19: 5 mg via INTRAVENOUS
  Administered 2020-01-19: 10 mg via INTRAVENOUS
  Administered 2020-01-19: 5 mg via INTRAVENOUS

## 2020-01-19 MED ORDER — BUPIVACAINE HCL (PF) 0.25 % IJ SOLN
INTRAMUSCULAR | Status: DC | PRN
  Administered 2020-01-19: 30 mL

## 2020-01-19 MED ORDER — ONDANSETRON HCL 4 MG/2ML IJ SOLN
INTRAMUSCULAR | Status: AC
  Filled 2020-01-19: qty 2

## 2020-01-19 MED ORDER — CHLORHEXIDINE GLUCONATE CLOTH 2 % EX PADS
6.0000 | MEDICATED_PAD | Freq: Once | CUTANEOUS | Status: DC
  Filled 2020-01-19: qty 6

## 2020-01-19 MED ORDER — 0.9 % SODIUM CHLORIDE (POUR BTL) OPTIME
TOPICAL | Status: DC | PRN
  Administered 2020-01-19: 500 mL

## 2020-01-19 MED ORDER — PROPOFOL 10 MG/ML IV BOLUS
INTRAVENOUS | Status: AC
  Filled 2020-01-19: qty 40

## 2020-01-19 MED ORDER — HYDROMORPHONE HCL 1 MG/ML IJ SOLN
0.2500 mg | INTRAMUSCULAR | Status: DC | PRN
  Administered 2020-01-19 (×2): 0.25 mg via INTRAVENOUS
  Administered 2020-01-19: 12:00:00 0.5 mg via INTRAVENOUS
  Filled 2020-01-19: qty 0.5

## 2020-01-19 MED ORDER — FENTANYL CITRATE (PF) 100 MCG/2ML IJ SOLN
INTRAMUSCULAR | Status: DC | PRN
  Administered 2020-01-19 (×4): 25 ug via INTRAVENOUS
  Administered 2020-01-19: 50 ug via INTRAVENOUS

## 2020-01-19 MED ORDER — LIDOCAINE 2% (20 MG/ML) 5 ML SYRINGE
INTRAMUSCULAR | Status: DC | PRN
  Administered 2020-01-19: 1.5 mg/kg/h via INTRAVENOUS

## 2020-01-19 MED ORDER — DEXAMETHASONE SODIUM PHOSPHATE 10 MG/ML IJ SOLN
INTRAMUSCULAR | Status: DC | PRN
  Administered 2020-01-19: 8 mg via INTRAVENOUS

## 2020-01-19 MED ORDER — OXYCODONE HCL 5 MG PO TABS
ORAL_TABLET | ORAL | Status: AC
  Filled 2020-01-19: qty 1

## 2020-01-19 MED ORDER — DEXAMETHASONE SODIUM PHOSPHATE 10 MG/ML IJ SOLN
INTRAMUSCULAR | Status: AC
  Filled 2020-01-19: qty 1

## 2020-01-19 MED ORDER — EPHEDRINE 5 MG/ML INJ
INTRAVENOUS | Status: AC
  Filled 2020-01-19: qty 10

## 2020-01-19 MED ORDER — OXYCODONE HCL 5 MG/5ML PO SOLN
5.0000 mg | Freq: Once | ORAL | Status: AC | PRN
  Filled 2020-01-19: qty 5

## 2020-01-19 MED ORDER — ACETAMINOPHEN 500 MG PO TABS
ORAL_TABLET | ORAL | Status: AC
  Filled 2020-01-19: qty 2

## 2020-01-19 MED ORDER — CEFAZOLIN SODIUM-DEXTROSE 2-4 GM/100ML-% IV SOLN
INTRAVENOUS | Status: AC
  Filled 2020-01-19: qty 100

## 2020-01-19 MED ORDER — ACETAMINOPHEN 500 MG PO TABS
1000.0000 mg | ORAL_TABLET | ORAL | Status: AC
  Administered 2020-01-19: 1000 mg via ORAL
  Filled 2020-01-19: qty 2

## 2020-01-19 MED ORDER — OXYCODONE HCL 5 MG PO TABS
5.0000 mg | ORAL_TABLET | Freq: Once | ORAL | Status: AC | PRN
  Administered 2020-01-19: 14:00:00 5 mg via ORAL
  Filled 2020-01-19: qty 1

## 2020-01-19 MED ORDER — KETOROLAC TROMETHAMINE 30 MG/ML IJ SOLN
30.0000 mg | Freq: Once | INTRAMUSCULAR | Status: DC | PRN
  Filled 2020-01-19: qty 1

## 2020-01-19 MED ORDER — TRAMADOL HCL 50 MG PO TABS: 50.0000 mg | ORAL_TABLET | Freq: Four times a day (QID) | ORAL | 0 refills | Status: AC | PRN

## 2020-01-19 MED ORDER — LACTATED RINGERS IV SOLN
INTRAVENOUS | Status: DC
  Filled 2020-01-19: qty 1000

## 2020-01-19 MED ORDER — HYDROMORPHONE HCL 1 MG/ML IJ SOLN
INTRAMUSCULAR | Status: AC
  Filled 2020-01-19: qty 1

## 2020-01-19 MED ORDER — BUPIVACAINE LIPOSOME 1.3 % IJ SUSP
INTRAMUSCULAR | Status: DC | PRN
  Administered 2020-01-19: 20 mL

## 2020-01-19 MED ORDER — LIDOCAINE 2% (20 MG/ML) 5 ML SYRINGE
INTRAMUSCULAR | Status: AC
  Filled 2020-01-19: qty 10

## 2020-01-19 MED ORDER — PROPOFOL 10 MG/ML IV BOLUS
INTRAVENOUS | Status: DC | PRN
  Administered 2020-01-19: 30 mg via INTRAVENOUS
  Administered 2020-01-19: 200 mg via INTRAVENOUS
  Administered 2020-01-19: 20 mg via INTRAVENOUS

## 2020-01-19 MED ORDER — MIDAZOLAM HCL 2 MG/2ML IJ SOLN
INTRAMUSCULAR | Status: DC | PRN
  Administered 2020-01-19: 2 mg via INTRAVENOUS

## 2020-01-19 MED ORDER — GLYCOPYRROLATE PF 0.2 MG/ML IJ SOSY
PREFILLED_SYRINGE | INTRAMUSCULAR | Status: DC | PRN
  Administered 2020-01-19: .2 mg via INTRAVENOUS

## 2020-01-19 MED ORDER — ONDANSETRON HCL 4 MG/2ML IJ SOLN
INTRAMUSCULAR | Status: DC | PRN
  Administered 2020-01-19 (×2): 4 mg via INTRAVENOUS

## 2020-01-19 MED ORDER — CEFAZOLIN SODIUM-DEXTROSE 2-4 GM/100ML-% IV SOLN
2.0000 g | INTRAVENOUS | Status: AC
  Administered 2020-01-19: 2 g via INTRAVENOUS
  Filled 2020-01-19: qty 100

## 2020-01-19 MED ORDER — LIDOCAINE 2% (20 MG/ML) 5 ML SYRINGE
INTRAMUSCULAR | Status: DC | PRN
  Administered 2020-01-19: 100 mg via INTRAVENOUS

## 2020-01-19 SURGICAL SUPPLY — 38 items
COVER BACK TABLE 80X110 HD (DRAPES) ×5 IMPLANT
COVER MAYO STAND STRL (DRAPES) ×5 IMPLANT
COVER WAND RF STERILE (DRAPES) ×5 IMPLANT
DECANTER SPIKE VIAL GLASS SM (MISCELLANEOUS) ×3 IMPLANT
DERMABOND ADVANCED (GAUZE/BANDAGES/DRESSINGS) ×2
DERMABOND ADVANCED .7 DNX12 (GAUZE/BANDAGES/DRESSINGS) ×3 IMPLANT
DRAIN PENROSE 0.5X18 (DRAIN) ×5 IMPLANT
DRAPE LAPAROTOMY TRNSV 102X78 (DRAPES) ×5 IMPLANT
GLOVE BIO SURGEON STRL SZ7.5 (GLOVE) ×11 IMPLANT
GLOVE BIOGEL PI IND STRL 6.5 (GLOVE) IMPLANT
GLOVE BIOGEL PI INDICATOR 6.5 (GLOVE) ×2
GLOVE INDICATOR 8.0 STRL GRN (GLOVE) ×5 IMPLANT
GLOVE SURG SS PI 6.5 STRL IVOR (GLOVE) ×6 IMPLANT
GOWN STRL REUS W/TWL XL LVL3 (GOWN DISPOSABLE) ×14 IMPLANT
KIT TURNOVER CYSTO (KITS) IMPLANT
LIGASURE IMPACT 36 18CM CVD LR (INSTRUMENTS) ×2 IMPLANT
MESH ULTRAPRO 3X6 7.6X15CM (Mesh General) ×2 IMPLANT
NEEDLE HYPO 22GX1.5 SAFETY (NEEDLE) ×5 IMPLANT
NS IRRIG 1000ML POUR BTL (IV SOLUTION) ×5 IMPLANT
PACK BASIN DAY SURGERY FS (CUSTOM PROCEDURE TRAY) ×5 IMPLANT
PENCIL SMOKE EVACUATOR (MISCELLANEOUS) ×2 IMPLANT
SPONGE LAP 4X18 RFD (DISPOSABLE) ×9 IMPLANT
SUT ETHIBOND 0 MO6 C/R (SUTURE) ×12 IMPLANT
SUT MNCRL AB 4-0 PS2 18 (SUTURE) ×7 IMPLANT
SUT PROLENE 4 0 PS 2 18 (SUTURE) ×6 IMPLANT
SUT VIC AB 2-0 SH 27 (SUTURE) ×2
SUT VIC AB 2-0 SH 27X BRD (SUTURE) ×3 IMPLANT
SUT VIC AB 3-0 SH 27 (SUTURE) ×4
SUT VIC AB 3-0 SH 27X BRD (SUTURE) ×3 IMPLANT
SYR 20ML LL LF (SYRINGE) ×3 IMPLANT
SYR BULB IRRIGATION 50ML (SYRINGE) ×2 IMPLANT
TOWEL OR 17X26 10 PK STRL BLUE (TOWEL DISPOSABLE) ×5 IMPLANT
TRAY FOL W/BAG SLVR 16FR STRL (SET/KITS/TRAYS/PACK) IMPLANT
TRAY FOLEY W/BAG SLVR 14FR LF (SET/KITS/TRAYS/PACK) ×3 IMPLANT
TRAY FOLEY W/BAG SLVR 16FR LF (SET/KITS/TRAYS/PACK) ×2
TUBE CONNECTING 12'X1/4 (SUCTIONS) ×1
TUBE CONNECTING 12X1/4 (SUCTIONS) ×1 IMPLANT
YANKAUER SUCT BULB TIP NO VENT (SUCTIONS) ×2 IMPLANT

## 2020-01-19 NOTE — Transfer of Care (Signed)
   Last Vitals:  Vitals Value Taken Time  BP    Temp    Pulse 107 01/19/20 1139  Resp 21 01/19/20 1139  SpO2 100 % 01/19/20 1139  Vitals shown include unvalidated device data.  Last Pain:  Vitals:   01/19/20 0701  PainSc: 5       Patients Stated Pain Goal: 5 (01/19/20 0701)  Complications: Immediate Anesthesia Transfer of Care Note  Patient: Victor Stevens  Procedure(s) Performed: Procedure(s) (LRB): LEFT INGUINAL HERNIA REPAIR (Left) INSERTION OF MESH (Left) EXAM UNDER ANESTHESIA (N/A) LEFT ORCHIOPEXY (Left)  Patient Location: PACU  Anesthesia Type: General  Level of Consciousness: drowsy  Airway & Oxygen Therapy: Patient Spontanous Breathing and Patient connected to nasal cannula oxygen  Post-op Assessment: Report given to PACU RN and Post -op Vital signs reviewed and stable  Post vital signs: Reviewed and stable  Complications: No apparent anesthesia complications

## 2020-01-19 NOTE — Op Note (Signed)
Preoperative diagnosis: Left incarcerated inguinal hernia  Postoperative diagnosis: Atrophic left testis, cryptorchidism  Procedure: Left orchidopexy  Surgeon: Moody Bruins MD  Anesthesia: General  Complications: None  EBL: Minimal  Indication: Victor Stevens is a 34 year old gentleman who was noted to have an incarcerated left inguinal hernia.  He was evaluated by Dr. Marin Olp and underwent left inguinal exploration with repair of his hernia today.  Intraoperatively, Dr. Cliffton Asters noticed that he did have what appeared to be an attached atrophic left testis to his hernia sac that was found to be high riding in the inguinal canal.  Intraoperative condition was therefore obtained.  Description of procedure: I was called to the operating room with Dr. Cliffton Asters noticed an atrophic left testis that was located high within the inguinal canal.  I scrubbed in and examined the structure and could palpate his vas deferens.  He did have what appeared to be a left testis that was atrophic.  I contacted his wife and confirmed that they are still interested in optimizing fertility.  Despite the fact that this testis was atrophic and not likely to be highly functional, all efforts were made to preserve this testis.  I assisted Dr. Cliffton Asters with further mobilization of the inguinal structures.  I then helped to isolate the testis and spermatic cord from the surrounding hernia sac and adjacent tissue with care to preserve the left testicular blood supply and vas deferens.  Dr. Cliffton Asters completed the hernia repair.  With additional length on the spermatic cord, I was able to bring the left testis down into the superior left scrotum.  I made a small horizontal incision in the left hemiscrotum and divided the underlying dartos tissue.  I was able to pass a hemostat up through the scrotum into the inguinal canal was able to grab the adjacent inferior tissue attached to the left testis and bring this down into the upper  scrotum.  Dr. Cliffton Asters then completed the hernia repair and closure of the external oblique aponeurosis and closed his inguinal incision.  Attention then turned to the scrotum.  Using 4-0 Prolene sutures, I placed 3 fixating sutures in the inferior, lateral, and medial aspect of the left hemiscrotum and attached these to the tissue adjacent to the left atrophic testis to help fixate it in place in the upper left hemiscrotum.  The dartos fascia was then closed with a running 4-0 Monocryl suture.  The skin was reapproximated with a running vertical mattress 4-0 Monocryl suture.  Dermabond was applied.  The patient tolerated the procedure and without complications.  I did speak with [name] wife and instructed her to notify me should they have any future difficulties with fertility or left testicular pain.

## 2020-01-19 NOTE — Discharge Instructions (Addendum)
POST OP INSTRUCTIONS  1. DIET: As tolerated. Follow a light bland diet the first 24 hours after arrival home, such as soup, liquids, crackers, etc.  Be sure to include lots of fluids daily.  Avoid fast food or heavy meals as your are more likely to get nauseated.  Eat a low fat the next few days after surgery.  2. Take your usually prescribed home medications unless otherwise directed.  3. PAIN CONTROL: a. Pain is best controlled by a usual combination of three different methods TOGETHER: i. Ice/Heat ii. Over the counter pain medication iii. Prescription pain medication b. Most patients will experience some swelling and bruising around the surgical site.  Ice packs or heating pads (30-60 minutes up to 6 times a day) will help. Some people prefer to use ice alone, heat alone, alternating between ice & heat.  Experiment to what works for you.  Swelling and bruising can take several weeks to resolve.   c. It is helpful to take an over-the-counter pain medication regularly for the first few weeks: i. Ibuprofen (Motrin/Advil) - 200mg  tabs - take 3 tabs (600mg ) every 6 hours as needed for pain ii. Acetaminophen (Tylenol) - you may take 650mg  every 6 hours as needed. You can take this with motrin as they act differently on the body. If you are taking a narcotic pain medication that has acetaminophen in it, do not take over the counter tylenol at the same time.  Iii. NOTE: You may take both of these medications together - most patients  find it most helpful when alternating between the two (i.e. Ibuprofen at 6am, tylenol at 9am, ibuprofen at 12pm ...) d. A  prescription for pain medication should be given to you upon discharge.  Take your pain medication as prescribed if your pain is not adequatly controlled with the over-the-counter pain reliefs mentioned above.  4. Avoid getting constipated.  Between the surgery and the pain medications, it is common to experience some constipation.  Increasing fluid  intake and taking a fiber supplement (such as Metamucil, Citrucel, FiberCon, MiraLax, etc) 1-2 times a day regularly will usually help prevent this problem from occurring.  A mild laxative (prune juice, Milk of Magnesia, MiraLax, etc) should be taken according to package directions if there are no bowel movements after 48 hours.    5. Dressing: Your incision is covered in Dermabond which is like sterile superglue for the skin. This will come off on it's own in a couple weeks. It is waterproof and you may bathe normally starting the day after your surgery in a shower. Avoid baths/pools/lakes/oceans until your wounds have fully healed.  6. ACTIVITIES as tolerated:   a. Avoid heavy lifting (>10lbs or 1 gallon of milk) for the next 6 weeks. b. You may resume regular (light) daily activities beginning the next day--such as daily self-care, walking, climbing stairs--gradually increasing activities as tolerated.  If you can walk 30 minutes without difficulty, it is safe to try more intense activity such as jogging, treadmill, bicycling, low-impact aerobics.  c. DO NOT PUSH THROUGH PAIN.  Let pain be your guide: If it hurts to do something, don't do it. d. may drive when you are no longer taking prescription pain medication, you can comfortably wear a seatbelt, and you can safely maneuver your car and apply brakes. e. may have sexual intercourse when it is comfortable.   7. FOLLOW UP in our office a. Please call [facility] at [phone] to set up an appointment to  see your surgeon in the office for a follow-up appointment approximately 2 weeks after your surgery. b. Make sure that you call for this appointment the day you arrive home to insure a convenient appointment time.  9. If you have disability or family leave forms that need to be completed, you may have them completed by your primary care physician's office; for return to work instructions, please ask our office staff and they will be happy to  assist you in obtaining this documentation   When to call us [phone]: 1. Poor pain control 2. Reactions / problems with new medications (rash/itching, etc)  3. Fever over 101.5 F (38.5 C) 4. Inability to urinate 5. Nausea/vomiting 6. Worsening swelling or bruising 7. Continued bleeding from incision. 8. Increased pain, redness, or drainage from the incision  The clinic staff is available to answer your questions during regular business hours (8:30am-5pm).  Please don't hesitate to call and ask to speak to one of our nurses for clinical concerns.   A surgeon from [facility] is always on call at the hospitals   If you have a medical emergency, go to the nearest emergency room or call 911.  [facility] [address], [place], [place]  [address] MAIN: [phone] FAX: [phone] [email or web address]  Information for Discharge Teaching: EXPAREL (bupivacaine liposome injectable suspension)   Your surgeon or anesthesiologist gave you EXPAREL(bupivacaine) to help control your pain after surgery.   EXPAREL is a local anesthetic that provides pain relief by numbing the tissue around the surgical site.  EXPAREL is designed to release pain medication over time and can control pain for up to 72 hours.  Depending on how you respond to EXPAREL, you may require less pain medication during your recovery.  Possible side effects:  Temporary loss of sensation or ability to move in the area where bupivacaine was injected.  Nausea, vomiting, constipation  Rarely, numbness and tingling in your mouth or lips, lightheadedness, or anxiety may occur.  Call your doctor right away if you think you may be experiencing any of these sensations, or if you have other questions regarding possible side effects.  Follow all other discharge instructions given to you by your surgeon or nurse. Eat a healthy diet and drink plenty of water or  other fluids.  If you return to the hospital for any reason within 96 hours following the administration of EXPAREL, it is important for health care providers to know that you have received this anesthetic. A teal colored band has been placed on your arm with the date, time and amount of EXPAREL you have received in order to alert and inform your health care providers. Please leave this armband in place for the full 96 hours following administration, and then you may remove the band.  Post Anesthesia Home Care Instructions  Activity: Get plenty of rest for the remainder of the day. A responsible individual must stay with you for 24 hours following the procedure.  For the next 24 hours, DO NOT: -Drive a car -Advertising copywriter -Drink alcoholic beverages -Take any medication unless instructed by your physician -Make any legal decisions or sign important papers.  Meals: Start with liquid foods such as gelatin or soup. Progress to regular foods as tolerated. Avoid greasy, spicy, heavy foods. If nausea and/or vomiting occur, drink only clear liquids until the nausea and/or vomiting subsides. Call your physician if vomiting continues.  Special Instructions/Symptoms: Your throat may feel dry or sore  from the anesthesia or the breathing tube placed in your throat during surgery. If this causes discomfort, gargle with warm salt water. The discomfort should disappear within 24 hours.

## 2020-01-19 NOTE — H&P (Signed)
CC: Referred by PCP for left groin bulge; here today for surgery  HPI: [name] is a very pleasant 54yoM with 15-74yrhx of left groin bulge that has steadily increased in size in the last couple of years. It has also become increasingly more symptomatic. He reports discomfort in the left groin particularly with lifting as well as coughing/sneezing. He denies history of constant unremitting pain or incarceration-like symptoms. He denies ever having had surgery before including in the groin. His discomfort is alleviated by modifying his activities or taking a break from lifting. He denies history of fever/chills/nausea/vomiting/abdominal bloating when he has had discomfort.  INTERVAL HX Was scheduled for surgery last month but cancelled due to bad weather threat. He denies any changes in his health or health history since we met in the office. Persistent achy pains in left groin, unchanged. Denies complaints this morning.  PMH: Denies any other health problems or health history  PSH: Denies any prior surgical history  FHx: Denies FHx of malignancy  Social: Denies use of tobacco/EtOH/drugs. He works at [employer] part-time as well as at [employer] he does heavy lifting  ROS: A comprehensive 10 system review of systems was completed with the patient and pertinent findings as noted above.  Past Medical History:  Diagnosis Date  . ADHD (attention deficit hyperactivity disorder)   . Eczema   . Headache    migraine  . Inguinal hernia    left  . Wears glasses     Past Surgical History:  Procedure Laterality Date  . NO PAST SURGERIES  11/2019    Family History  Problem Relation Age of Onset  . Depression Father   . Cancer Maternal Aunt   . Heart disease Neg Hx   . Diabetes Neg Hx   . Stroke Neg Hx     Social:  reports that he has never smoked. He has never used smokeless tobacco. He reports current alcohol use. He reports that he does not use drugs.  Allergies:    Allergies  Allergen Reactions  . Other Other (See Comments)    Seafood makes me nauseous    Medications: I have reviewed the patient's current medications.  No results found for this or any previous visit (from the past 48 hour(s)).  No results found.  ROS - all of the below systems have been reviewed with the patient and positives are indicated with bold text General: chills, fever or night sweats Eyes: blurry vision or double vision ENT: epistaxis or sore throat Allergy/Immunology: itchy/watery eyes or nasal congestion Hematologic/Lymphatic: bleeding problems, blood clots or swollen lymph nodes Endocrine: temperature intolerance or unexpected weight changes Breast: new or changing breast lumps or nipple discharge Resp: cough, shortness of breath, or wheezing CV: chest pain or dyspnea on exertion GI: as per HPI GU: dysuria, trouble voiding, or hematuria MSK: joint pain or joint stiffness Neuro: TIA or stroke symptoms Derm: pruritus and skin lesion changes Psych: anxiety and depression  PE Blood pressure 132/82, temperature 97.6 F (36.4 C), resp. rate 16, height _0  (1.651 m), weight 85 kg, SpO2 99 %. Constitutional: NAD; conversant; no deformities Eyes: Moist conjunctiva; no lid lag; anicteric; PERRL Neck: Trachea midline; no thyromegaly Lungs: Normal respiratory effort; no tactile fremitus CV: RRR; no palpable thrills; no pitting edema GI: Abd soft, NT/ND; left groin hernia reducible; marked; no palpable hepatosplenomegaly MSK: Normal range of motion of extremities; no clubbing/cyanosis Psychiatric: Appropriate affect; alert and oriented x3 Lymphatic: No palpable cervical or axillary lymphadenopathy  No results found for this or any previous visit (from the past 48 hour(s)).  No results found.   A/P: [name] is a very pleasant 76yoM with symptomatic reducible left inguinal hernia  -The anatomy and physiology of the GI tract and abdominal wall was discussed  at length with the patient. The pathophysiology of hernias was discussed at length as well. -Given symptoms, we discussed proceeding with repair. We discussed open left inguinal hernia repair with mesh. We discussed recurrence rates being much lower when mesh is used as opposed to suture repair only. -The planned procedure, material risks (including, but not limited to, pain, bleeding, infection, scarring, need for blood transfusion, damage to surrounding structures-blood vessels/nerves/viscus/organs, need for additional procedures, recurrence of hernia, chronic long-term pain in 1-2% of cases, mesh complications including erosion into other structures/vessels/organs/viscus, pneumonia, heart attack, stroke, death) benefits and alternatives to surgery were discussed at length. I noted a good probability that the procedure help improve his symptoms. The patient's questions were answered to his satisfaction, he voiced understanding and they elected to proceed with surgery. Additionally, we discussed typical postoperative expectations and the recovery process including the fact that he would be on light or modified duty with the lifting extraction of 10 pounds for 4-6 weeks after surgery. If no pain at 4 weeks with activity, gradually returning to all activity as tolerated; otherwise plans to wait the full 6 weeks. He expressed understanding.  Sharon Mt. Dema Severin, M.D. [facility] Use [email or web address] to contact on call provider

## 2020-01-19 NOTE — Anesthesia Procedure Notes (Signed)
Procedure Name: LMA Insertion Date/Time: 01/19/2020 8:26 AM Performed by: Norva Pavlov, CRNA Pre-anesthesia Checklist: Patient identified, Emergency Drugs available, Suction available and Patient being monitored Patient Re-evaluated:Patient Re-evaluated prior to induction Oxygen Delivery Method: Circle system utilized Preoxygenation: Pre-oxygenation with 100% oxygen Induction Type: IV induction Ventilation: Mask ventilation without difficulty LMA: LMA with gastric port inserted LMA Size: 4.0 Number of attempts: 1 Airway Equipment and Method: Bite block Placement Confirmation: positive ETCO2 Tube secured with: Tape Dental Injury: Teeth and Oropharynx as per pre-operative assessment

## 2020-01-19 NOTE — Anesthesia Postprocedure Evaluation (Signed)
Anesthesia Post Note  Patient: Victor Stevens  Procedure(s) Performed: LEFT INGUINAL HERNIA REPAIR (Left Groin) INSERTION OF MESH (Left Groin) EXAM UNDER ANESTHESIA (N/A Scrotum) LEFT ORCHIOPEXY (Left Scrotum)     Patient location during evaluation: PACU Anesthesia Type: General Level of consciousness: awake and alert Pain management: pain level controlled Vital Signs Assessment: post-procedure vital signs reviewed and stable Respiratory status: spontaneous breathing, nonlabored ventilation, respiratory function stable and patient connected to nasal cannula oxygen Cardiovascular status: blood pressure returned to baseline and stable Postop Assessment: no apparent nausea or vomiting Anesthetic complications: no    Last Vitals:  Vitals:   01/19/20 0701 01/19/20 1137  BP: 132/82 133/79  Pulse:  (!) 102  Resp: 16 19  Temp: 36.4 C   SpO2: 99% 100%    Last Pain:  Vitals:   01/19/20 0701  PainSc: 5                  Trevor Iha

## 2020-01-19 NOTE — Op Note (Addendum)
01/19/2020  11:49 AM  PATIENT:  Victor Stevens  34 y.o. male  Patient Care Team: Stevens, Victor Balo, PA-C as PCP - General (Family Medicine)  PRE-OPERATIVE DIAGNOSIS:  Symptomatic left inguinal hernia  POST-OPERATIVE DIAGNOSIS:  Incarcerated left indirect inguinal hernia  PROCEDURE:   1. Open left inguinal hernia repair with mesh 2. Partial omentectomy  SURGEON:  Victor Coup. Yumiko Alkins, MD  ANESTHESIA:  General endotracheal  COUNTS:  Sponge, needle and instrument counts were reported correct x2 at the conclusion of the operation.  EBL: 50 mL  DRAINS: None  SPECIMEN: Omentum  FINDINGS: Viable normal omentum, indirect left inguinal hernia. Very attenuated external oblique that was thinned and had ~no muscle fibers remaining. Defect in floor ~1.5 cm wide and softball size of omentum external to this. This was partially reducible but due to mass of omentum, not all of it would fit. Therefore, partial omentectomy carried out removing a softball sized wad of omentum. Left testicle was high in the inguinal canal and atrophic in appearance. Victor Stevens was able to successfully pexy this high in the left scrotum.  INDICATION: Victor Stevens is a very pleasant 34yoM with 15-54yr hx of left groin bulge that has steadily increased in size in the last couple of years. It has also become increasingly more symptomatic. He reports discomfort in the left groin particularly with lifting as well as coughing/sneezing.  He was seen and evaluated and determined to have a symptomatic left inguinal hernia. We discussed options moving forward and he opted to pursue surgery. Please refer to notes elsewhere for details regarding this discussion.  DESCRIPTION: The patient was identified in preop holding and taken to the OR where he was placed supine on the operating room table and SCDs were placed. General endotracheal anesthesia was induced without difficulty. Hair in the region of the planned incisions was  clipped. A foley catheter was placed by nursing under sterile conditions. The patient was then prepped and draped in the usual sterile fashion including the scrotum, thighs and penis in the prep. A surgical timeout was performed indicating the correct patient, procedure, positioning and need for preoperative antibiotics.  0.25% Marcaine + exparel was used to anesthetize the skin over the mid-portion of the inguinal canal. An oblique incision was made. Dissection was carried down through the subcutaneous tissue with cautery to the external oblique fascia.  The external oblique fascia was opened along the direction of its fibers to the external ring.  This was very attenuated and there were virtually no muscle fibers remaining.  This was likely due to the chronic longstanding hernia at this location. The spermatic cord and hernia sac was circumferentially dissected bluntly and retracted with a Penrose drain.  The ilioinguinal nerve was identified but ultimately found to be in the way of the mesh so rather than risking incorporation of the nerve with the mesh, was divided.  The floor of the inguinal canal was inspected.  The cord structures were all identified. The hernia sac was identified as was an atrophic appearing left testicle that was sitting high within the inguinal canal adjacent to the hernia sac.  The hernia sac extended into the scrotum but the testicle did not.  This appeared to be a partially undescended and atrophic left testicle.  Urology was notified for further evaluation.  The hernia sac was intimately associated with the left testicle.   The hernia was thin-walled and it was quite apparent that there was omentum within this.  Well away from the  cord structures, this was opened.  There was a softball sized swath of omentum within it.  On the a small portion of this was ultimately able to be reduced due to the small inguinal floor defect.  There was no bowel, colon or bladder within the hernia  sac.  Therefore, attention was turned to performing a partial omentectomy.  This was done using the LigaSure.  The omental stump was inspected and noted to be hemostatic.  The proximal end of the sac was able to be separated from the cord structures. This was an indirect defect. No direct component or significant cord lipoma was present. High ligation of the sac was performed under direct visualization and the sac suture ligated with an Ethibond suture. A piece of light weight prolene mesh was cut to fit the inguinal floor and deployed. The mesh was secured to the pubic tubercle using two 0 Ethibond sutures - ensuring that there was 2cm of overlap between the mesh and the pubic tubercle. The mesh was then secured using additional 0 Ethibond suture to the shelving edge of the inguinal ligament inferiorly and the conjoined tendon superiorly. The mesh was tucked underneath the external oblique fascia laterally.  The tails of the mesh were then closed around the spermatic cord to recreate the internal inguinal ring and this ring was secured using 0 Ethibond suture.   Victor Stevens was able to evaluate the left testicle and discuss the case over the phone with his wife.  He had a small, atrophic appearing partially undescended left testicle.  Please refer to his notes for details regarding this portion of the procedure.  I was able to help separate the testicle from the hernia sac and remove some of the excess hernia sac that was extending toward his testicle.  Ultimately, a left orchiopexy was performed and the testicle sits high within the left scrotum.  The spermatic cord and testicle did appear somewhat atrophic and retracted and it did not appear that this would reach into the bottom portion of the scrotum.  The inguinal area was irrigated with copious amounts of warm saline.  Hemostasis was then verified.  Prior to completing the scrotal portion of the surgery with Victor Stevens, we opted to go ahead and close the  groin.  The external oblique fascia was reapproximated with 2-0 Vicryl.  3-0 Vicryl was then used to close Scarpa's fascia. A running 4-0 Monocryl subcuticular suture was used to close the skin.  Dermabond was the applied.  We then completed Dr. Lynne Logan portion of the procedure.  He was then awakened from general anesthesia, extubated and taken to the recovery in satisfactory condition.  DISPOSITION: PACU in satisfactory condition

## 2020-01-20 LAB — SURGICAL PATHOLOGY

## 2020-01-24 ENCOUNTER — Other Ambulatory Visit (HOSPITAL_COMMUNITY): Payer: BC Managed Care – PPO

## 2020-01-25 ENCOUNTER — Encounter: Payer: Self-pay | Admitting: *Deleted

## 2020-02-21 ENCOUNTER — Telehealth: Payer: Self-pay | Admitting: Medical

## 2020-02-21 NOTE — Telephone Encounter (Signed)
Called pt unable to leave message mailbox is full

## 2020-02-21 NOTE — Telephone Encounter (Signed)
He sent me an email.  Let us get him in office in person to discuss further his concerns

## 2020-03-31 ENCOUNTER — Ambulatory Visit: Payer: Self-pay | Attending: Internal Medicine

## 2020-03-31 DIAGNOSIS — Z23 Encounter for immunization: Secondary | ICD-10-CM

## 2020-03-31 NOTE — Progress Notes (Signed)
   [facility]  Name:  Kennard Fildes Sr.    MRN: [id number] DOB: 1986/02/13  03/31/2020  Mr. Remache was observed post Covid-19 immunization for 15 minutes without incident. He was provided with Vaccine Information Sheet and instruction to access the V-Safe system.   Mr. Keats was instructed to call 911 with any severe reactions post vaccine: Marland Kitchen Difficulty breathing  . Swelling of face and throat  . A fast heartbeat  . A bad rash all over body  . Dizziness and weakness   Immunizations Administered    Name Date Dose VIS Date Route   Pfizer COVID-19 Vaccine 03/31/2020 11:48 AM 0.3 mL 12/29/2018 Intramuscular   Manufacturer: [employer], [employer]   Lot: [id number]   NDC: [address]-9

## 2020-04-25 ENCOUNTER — Ambulatory Visit: Payer: BC Managed Care – PPO | Attending: Internal Medicine

## 2020-05-02 ENCOUNTER — Ambulatory Visit: Payer: BC Managed Care – PPO | Attending: Internal Medicine

## 2020-05-02 DIAGNOSIS — Z23 Encounter for immunization: Secondary | ICD-10-CM

## 2020-05-02 NOTE — Progress Notes (Signed)
   [facility] Vaccination Clinic  Name:  Victor Stevens    MRN: [id number] DOB: 1986-01-18  05/02/2020  [name] was observed post Covid-19 immunization for 15 minutes without incident. He was provided with Vaccine Information Sheet and instruction to access the V-Safe system.   [name] was instructed to call 911 with any severe reactions post vaccine: Marland Kitchen Difficulty breathing  . Swelling of face and throat  . A fast heartbeat  . A bad rash all over body  . Dizziness and weakness   Immunizations Administered    Name Date Dose VIS Date Route   Pfizer COVID-19 Vaccine 05/02/2020  1:34 PM 0.3 mL 12/29/2018 Intramuscular   Manufacturer: [employer], [employer]   Lot: [id number]   NDC: [address]-2

## 2020-06-06 ENCOUNTER — Emergency Department (HOSPITAL_COMMUNITY)
Admission: EM | Admit: 2020-06-06 | Discharge: 2020-06-08 | Disposition: A | Discharge status: Other Acute Inpatient Hospital | Payer: BC Managed Care – PPO | Source: Home / Self Care | Attending: Emergency Medicine | Admitting: Emergency Medicine

## 2020-06-06 ENCOUNTER — Encounter (HOSPITAL_COMMUNITY): Payer: Self-pay | Admitting: Emergency Medicine

## 2020-06-06 DIAGNOSIS — F909 Attention-deficit hyperactivity disorder, unspecified type: Secondary | ICD-10-CM | POA: Insufficient documentation

## 2020-06-06 DIAGNOSIS — Z79899 Other long term (current) drug therapy: Secondary | ICD-10-CM | POA: Insufficient documentation

## 2020-06-06 DIAGNOSIS — R45851 Suicidal ideations: Secondary | ICD-10-CM | POA: Insufficient documentation

## 2020-06-06 DIAGNOSIS — Z20822 Contact with and (suspected) exposure to covid-19: Secondary | ICD-10-CM | POA: Insufficient documentation

## 2020-06-06 DIAGNOSIS — F332 Major depressive disorder, recurrent severe without psychotic features: Secondary | ICD-10-CM | POA: Insufficient documentation

## 2020-06-06 DIAGNOSIS — R519 Headache, unspecified: Secondary | ICD-10-CM | POA: Insufficient documentation

## 2020-06-06 DIAGNOSIS — L309 Dermatitis, unspecified: Secondary | ICD-10-CM | POA: Insufficient documentation

## 2020-06-06 NOTE — ED Triage Notes (Signed)
Patient in voluntary. Reports suicidal thoughts after having argument with wife. Patient called his HR manager who instructed him to come to ED for eval. Patient had no specific plan. Calm and cooperative.  Patient does have gun and knife in glove compartment of his car which he says he carries daily for safety reasons, had no intention to use weapons to harm himself.   Placed in scrubs and wanded by security. Belongings are in [place] [address] and have NOT been inventoried yet.

## 2020-06-07 DIAGNOSIS — F332 Major depressive disorder, recurrent severe without psychotic features: Secondary | ICD-10-CM | POA: Insufficient documentation

## 2020-06-07 LAB — CBC
HCT: 46.2 % (ref 39.0–52.0)
Hemoglobin: 14.5 g/dL (ref 13.0–17.0)
MCH: 28.3 pg (ref 26.0–34.0)
MCHC: 31.4 g/dL (ref 30.0–36.0)
MCV: 90.2 fL (ref 80.0–100.0)
Platelets: 234 10*3/uL (ref 150–400)
RBC: 5.12 MIL/uL (ref 4.22–5.81)
RDW: 11.9 % (ref 11.5–15.5)
WBC: 8.9 10*3/uL (ref 4.0–10.5)
nRBC: 0 % (ref 0.0–0.2)

## 2020-06-07 LAB — COMPREHENSIVE METABOLIC PANEL
ALT: 24 U/L (ref 0–44)
AST: 20 U/L (ref 15–41)
Albumin: 4.7 g/dL (ref 3.5–5.0)
Alkaline Phosphatase: 58 U/L (ref 38–126)
Anion gap: 13 (ref 5–15)
BUN: 10 mg/dL (ref 6–20)
CO2: 28 mmol/L (ref 22–32)
Calcium: 9.7 mg/dL (ref 8.9–10.3)
Chloride: 99 mmol/L (ref 98–111)
Creatinine, Ser: 0.99 mg/dL (ref 0.61–1.24)
GFR calc Af Amer: >60 mL/min (ref >60)
GFR calc non Af Amer: >60 mL/min (ref >60)
Glucose, Bld: 86 mg/dL (ref 70–99)
Potassium: 3.4 mmol/L — ABNORMAL LOW (ref 3.5–5.1)
Sodium: 140 mmol/L (ref 135–145)
Total Bilirubin: 2.1 mg/dL — ABNORMAL HIGH (ref 0.3–1.2)
Total Protein: 7.4 g/dL (ref 6.5–8.1)

## 2020-06-07 LAB — RAPID URINE DRUG SCREEN, HOSP PERFORMED
Amphetamines: NOT DETECTED
Barbiturates: NOT DETECTED
Benzodiazepines: NOT DETECTED
Cocaine: NOT DETECTED
Opiates: NOT DETECTED
Tetrahydrocannabinol: NOT DETECTED

## 2020-06-07 LAB — SALICYLATE LEVEL: Salicylate Lvl: <7 mg/dL — ABNORMAL LOW (ref 7.0–30.0)

## 2020-06-07 LAB — SARS CORONAVIRUS 2 BY RT PCR (HOSPITAL ORDER, PERFORMED IN ~~LOC~~ HOSPITAL LAB): SARS Coronavirus 2: NEGATIVE

## 2020-06-07 LAB — ETHANOL: Alcohol, Ethyl (B): <10 mg/dL (ref <10)

## 2020-06-07 LAB — ACETAMINOPHEN LEVEL: Acetaminophen (Tylenol), Serum: <10 ug/mL — ABNORMAL LOW (ref 10–30)

## 2020-06-07 MED ORDER — ACETAMINOPHEN 325 MG PO TABS
650.0000 mg | ORAL_TABLET | Freq: Once | ORAL | Status: AC
  Administered 2020-06-07: 650 mg via ORAL
  Filled 2020-06-07: qty 2

## 2020-06-07 NOTE — BH Assessment (Signed)
Comprehensive Clinical Assessment (CCA) Note  06/07/2020 Victor Stevens [id number]   Patient presented to [facility] with suicidal ideation.  Patient states that he has only been married for a year, but prior to getting married he states that he had an affair and his wife just confirmed it.  Patient states that she is threatening to leave and if she leaves, he does not what he will do.  Patient states that he is the sole bread-winner in the home and all the bills are on hi.  He states that he no longer likes his job, but feels trapped and forced to stay there because he is the one responsible to keep things going.  Patient also states that they are struggling financially.  Patient states that he has been having suicidal thoughts, but has no plan.  However, he has informed staff that there is a gun in his car as well as a knife.  He has not identified any plan for using them to hurt himself.  Patient states that he has no prior suicide attempts.  He states that he has been depressed and states that he he has seen a therapist in the past, but it did not help.  Patient denies any previous psychiatric hospitalizations  Patient denies HI/Psychosis.  He states that he might have a mixed drink "every blue moon, " but states that he is not a regular drinker.  Patient denies any history of abuse or self-mutilation.  Patient states that he has not been sleeping or eating because of all the stress that he is currently under.  When asked what he thinks would help his situation, patient indicates that he is not sure what he would do if his wife left him.  Patient presents as oriented, alert, but has depressed mood with a flat affect.  His thoughts are organized and his memory intact.  He does not appear to be responding to any internal stimuli.  Patient is moderately anxious.  His judgment, insight and impulse control are impaired.  Visit Diagnosis:  F33.2 MDD Recurrent Severe   CCA Screening, Triage and Referral  (STR)  Patient Reported Information How did you hear about [place]? Self  Referral name: No data recorded Referral phone number: No data recorded  Whom do you see for routine medical problems? No data recorded Practice/Facility Name: No data recorded Practice/Facility Phone Number: No data recorded Name of Contact: No data recorded Contact Number: No data recorded Contact Fax Number: No data recorded Prescriber Name: No data recorded Prescriber Address (if known): No data recorded  What Is the Reason for Your Visit/Call Today? Patient is suicidal.  He states that his wife is about to leave him because he had an affair, he states that he hates his job and he states that he has no local family support.  How Long Has This Been Causing You Problems? <Week  What Do You Feel Would Help You the Most Today? Other (Comment) (patient wants inpatient treatment)   Have You Recently Been in Any Inpatient Treatment (Hospital/Detox/Crisis Center/28-Day Program)? No  Name/Location of Program/Hospital:No data recorded How Long Were You There? No data recorded When Were You Discharged? No data recorded  Have You Ever Received Services From [facility] Before? No  Who Do You See at [facility]? No data recorded  Have You Recently Had Any Thoughts About Hurting Yourself? Yes  Are You Planning to Commit Suicide/Harm Yourself At This time? No   Have you Recently Had Thoughts About Hurting Someone  Else? No  Explanation: No data recorded  Have You Used Any Alcohol or Drugs in the Past 24 Hours? No  How Long Ago Did You Use Drugs or Alcohol? No data recorded What Did You Use and How Much? No data recorded  Do You Currently Have a Therapist/Psychiatrist? Yes  Name of Therapist/Psychiatrist: was seeing a therapist through his EAP virtually, but stopped going   Have You Been Recently Discharged From Any Office Practice or Programs? No  Explanation of Discharge From Practice/Program: No data  recorded    CCA Screening Triage Referral Assessment Type of Contact: Tele-Assessment  Is this Initial or Reassessment? Initial Assessment  Date Telepsych consult ordered in [facility]:  06/07/20  Time Telepsych consult ordered in [facility]:  1250   Patient Reported Information Reviewed? Yes  Patient Left Without Being Seen? No data recorded Reason for Not Completing Assessment: No data recorded  Collateral Involvement: no collateral information available   Does Patient Have a Court Appointed Legal Guardian? No data recorded Name and Contact of Legal Guardian: No data recorded If Minor and Not Living with Parent(s), Who has Custody? No data recorded Is CPS involved or ever been involved? Never  Is APS involved or ever been involved? Never   Patient Determined To Be At Risk for Harm To Self or Others Based on Review of Patient Reported Information or Presenting Complaint? Yes, for Self-Harm  Method: No data recorded Availability of Means: No data recorded Intent: No data recorded Notification Required: No data recorded Additional Information for Danger to Others Potential: No data recorded Additional Comments for Danger to Others Potential: No data recorded Are There Guns or Other Weapons in Your Home? No data recorded Types of Guns/Weapons: No data recorded Are These Weapons Safely Secured?                            No data recorded Who Could Verify You Are Able To Have These Secured: No data recorded Do You Have any Outstanding Charges, Pending Court Dates, Parole/Probation? No data recorded Contacted To Inform of Risk of Harm To Self or Others: No data recorded  Location of Assessment: [facility]   Does Patient Present under Involuntary Commitment? No  IVC Papers Initial File Date: No data recorded  [place] of Residence: [place]   Patient Currently Receiving the Following Services: No data recorded  Determination of Need: No data recorded  Options For  Referral: Inpatient Hospitalization     CCA Biopsychosocial  Intake/Chief Complaint:  CCA Intake With Chief Complaint CCA Part Two Date: 06/07/20 CCA Part Two Time: 1401 Chief Complaint/Presenting Problem: Patient states that he has been married for the past year and admits that he had an affair prior to getting married.  He states that his wife just found out and she is threatening to leave.  Patient states that he also hates his job, but he is the only one working and it is his job to pay all the bills and he states that he and his wife are struggling financially.  Patient states that he has no local support and if his wife leaves and takes his child that he will have nothing left and he is not sure what he would do. Patient's Currently Reported Symptoms/Problems: Patient is depressed and anxious.  He appears to have given up on life. Individual's Strengths: Patient states that he is a good provider and puts others ahead of himself. Individual's Preferences: Patient  has no preferences that require accomodation other than wanting to have his phone on the unit. Individual's Abilities: Patient states that he can play the saxohone Type of Services Patient Feels Are Needed: Patient states that he needs inpatient treatment  Mental Health Symptoms Depression:  Depression: Increase/decrease in appetite, Hopelessness, Fatigue, Difficulty Concentrating, Change in energy/activity, Worthlessness  Mania:  Mania: None  Anxiety:   Anxiety: Worrying, Sleep  Psychosis:  Psychosis: None  Trauma:  Trauma: None  Obsessions:  Obsessions: None  Compulsions:  Compulsions: None  Inattention:  Inattention: None  Hyperactivity/Impulsivity:  Hyperactivity/Impulsivity: N/A  Oppositional/Defiant Behaviors:  Oppositional/Defiant Behaviors: N/A, None  Emotional Irregularity:  Emotional Irregularity: Potentially harmful impulsivity, Frantic efforts to avoid abandonment  Other Mood/Personality Symptoms:      Mental  Status Exam Appearance and self-care  Stature:  Stature: Average  Weight:  Weight: Average weight  Clothing:  Clothing: Casual, Neat/clean  Grooming:  Grooming: Normal  Cosmetic use:  Cosmetic Use: None  Posture/gait:  Posture/Gait: Normal  Motor activity:  Motor Activity: Restless  Sensorium  Attention:  Attention: Normal  Concentration:  Concentration: Normal  Orientation:  Orientation: Object, Person, Place, Situation, Time, X5  Recall/memory:  Recall/Memory: Normal  Affect and Mood  Affect:  Affect: Depressed, Flat  Mood:  Mood: Depressed, Anxious  Relating  Eye contact:  Eye Contact: Normal  Facial expression:  Facial Expression: Depressed, Anxious, Sad  Attitude toward examiner:  Attitude Toward Examiner: Cooperative  Thought and Language  Speech flow: Speech Flow: Clear and Coherent, Normal  Thought content:  Thought Content: Appropriate to Mood and Circumstances  Preoccupation:  Preoccupations: None  Hallucinations:  Hallucinations: None  Organization:     Company secretaryxecutive Functions  Fund of Knowledge:  Fund of Knowledge: Good  Intelligence:  Intelligence: Above Average  Abstraction:  Abstraction: Normal  Judgement:  Judgement: Impaired  Reality Testing:  Reality Testing: Adequate  Insight:  Insight: Good  Decision Making:  Decision Making: Impulsive  Social Functioning  Social Maturity:  Social Maturity: Responsible  Social Judgement:  Social Judgement: Normal  Stress  Stressors:  Stressors: Family conflict, Surveyor, quantityinancial, Relationship, Work  Coping Ability:  Coping Ability: Deficient supports  Skill Deficits:  Skill Deficits: None  Supports:  Supports: Support needed (states that his wife is his main support)     Religion: Religion/Spirituality Are You A Religious Person?: Yes What is Your Religious Affiliation?: Christian How Might This Affect Treatment?: NA  Leisure/Recreation: Leisure / Recreation Do You Have Hobbies?: Yes Leisure and Hobbies: Shooting at the  gun range  Exercise/Diet: Exercise/Diet Do You Exercise?:  (not assessed) Have You Gained or Lost A Significant Amount of Weight in the Past Six Months?:  (not assessed) Do You Follow a Special Diet?:  (not assessed) Do You Have Any Trouble Sleeping?: Yes Explanation of Sleeping Difficulties: 3 hours per night   CCA Employment/Education  Employment/Work Situation: Employment / Work Situation Employment situation: Employed Where is patient currently employed?: [employer] How long has patient been employed?: not assessed Patient's job has been impacted by current illness: Yes Describe how patient's job has been impacted: employer is aware that he has depression issues. What is the longest time patient has a held a job?: not assessed Where was the patient employed at that time?: not assessed Has patient ever been in the [employer] and Companymilitary?: No  Education: Education Is Patient Currently Attending School?: No Last Grade Completed: 12 Name of High School: an unknown school in [place] Did You Graduate From [employer]?: Yes Did You  Attend College?: Yes What Type of College Degree Do you Have?: No degree, attended [employer] Did You Attend Graduate School?: No What Was Your Major?: not assessed Did You Have Any Special Interests In School?: not assessed Did You Have An Individualized Education Program (IIEP): No Did You Have Any Difficulty At School?: No Patient's Education Has Been Impacted by Current Illness: No   CCA Family/Childhood History  Family and Relationship History: Family history Marital status: Married Number of Years Married: 1 What types of issues is patient dealing with in the relationship?: patient was unfaithful to his wife Additional relationship information: wife is threatening to leave him Are you sexually active?: Yes What is your sexual orientation?: heterosexual Has your sexual activity been affected by drugs, alcohol, medication, or emotional stress?: N/A Does patient  have children?: Yes How many children?: 1 How is patient's relationship with their children?: Patient is close to his child who is a toddler  Childhood History:  Childhood History By whom was/is the patient raised?: Both parents Additional childhood history information: Patient states that he was raised in a good home. Description of patient's relationship with caregiver when they were a child: Patient states that he was not close to his parents because they worked all the time Patient's description of current relationship with people who raised him/her: Patient states that he calls and checks on them, but he states that they are not that close How were you disciplined when you got in trouble as a child/adolescent?: Patient states that he was disciplined fairly, no abuse Does patient have siblings?: Yes Number of Siblings: 1 Description of patient's current relationship with siblings: not that close, calls and checks on her Did patient suffer any verbal/emotional/physical/sexual abuse as a child?: No Did patient suffer from severe childhood neglect?: No Has patient ever been sexually abused/assaulted/raped as an adolescent or adult?: No Was the patient ever a victim of a crime or a disaster?: No Witnessed domestic violence?: No Has patient been affected by domestic violence as an adult?: No  Child/Adolescent Assessment:     CCA Substance Use  Alcohol/Drug Use:                           ASAM's:  Six Dimensions of Multidimensional Assessment  Dimension 1:  Acute Intoxication and/or Withdrawal Potential:      Dimension 2:  Biomedical Conditions and Complications:      Dimension 3:  Emotional, Behavioral, or Cognitive Conditions and Complications:     Dimension 4:  Readiness to Change:     Dimension 5:  Relapse, Continued use, or Continued Problem Potential:     Dimension 6:  Recovery/Living Environment:     ASAM Severity Score:    ASAM Recommended Level of Treatment:      Substance use Disorder (SUD)    Recommendations for Services/Supports/Treatments:    DSM5 Diagnoses: Patient Active Problem List   Diagnosis Date Noted  . Severe episode of recurrent major depressive disorder, without psychotic features (HCC)   . Screening for lipid disorders 11/23/2019  . Chest discomfort 11/23/2019  . Need for Tdap vaccination 11/23/2019  . Unilateral inguinal hernia without obstruction or gangrene 11/23/2019    Disposition:  Per Denzil Magnuson, NP, Inpatient is recommended   Referrals to Alternative Service(s): Referred to Alternative Service(s):   Place:   Date:   Time:    Referred to Alternative Service(s):   Place:   Date:   Time:    Referred  to Alternative Service(s):   Place:   Date:   Time:    Referred to Alternative Service(s):   Place:   Date:   Time:     Arnoldo Lenis Sprinkle

## 2020-06-07 NOTE — ED Notes (Signed)
Pt states he would like to go outside to obtain belongings. Pt informed this was not a choice. Pt voluntary and required to stay inside hospital for treatment. Sitter at bedside made aware of policy.

## 2020-06-07 NOTE — ED Provider Notes (Signed)
[facility] EMERGENCY DEPARTMENT Provider Note   CSN: [id number] Arrival date & time: 06/06/20  2206     History Chief Complaint  Patient presents with  . Suicidal    Victor Stevens is a 34 y.o. male.  HPI   34 year old male with a history of ADHD, eczema, headache, inguinal hernia, glasses, who presents the emergency department today for evaluation of suicidal ideations.  Patient states that he might be getting a divorce because of this has been recently feeling very depressed and suicidal.  He does not have any specific plan to harm himself.  He denies any HI or AVH.  Denies any drug or frequent alcohol use.  States he is never been treated for depression or anxiety in the past.  He is complaining of a mild headache as he has been waiting to be seen for an extended period of time in the waiting room. otherwise has no medical complaints at this time.  Past Medical History:  Diagnosis Date  . ADHD (attention deficit hyperactivity disorder)   . Eczema   . Headache    migraine  . Inguinal hernia    left  . Wears glasses     Patient Active Problem List   Diagnosis Date Noted  . Severe episode of recurrent major depressive disorder, without psychotic features (HCC)   . Screening for lipid disorders 11/23/2019  . Chest discomfort 11/23/2019  . Need for Tdap vaccination 11/23/2019  . Unilateral inguinal hernia without obstruction or gangrene 11/23/2019    Past Surgical History:  Procedure Laterality Date  . INGUINAL HERNIA REPAIR Left 01/19/2020   Procedure: LEFT INGUINAL HERNIA REPAIR;  Surgeon: Andria Meuse, MD;  Location[facility];  Service: General;  Laterality: Left;  . INSERTION OF MESH Left 01/19/2020   Procedure: INSERTION OF MESH;  Surgeon: Andria Meuse, MD;  Location: [facility];  Service: General;  Laterality: Left;  . NO PAST SURGERIES  11/2019  . ORCHIOPEXY Left 01/19/2020   Procedure: LEFT  ORCHIOPEXY;  Surgeon: Heloise Purpura, MD;  Location: [facility];  Service: Urology;  Laterality: Left;       Family History  Problem Relation Age of Onset  . Depression Father   . Cancer Maternal Aunt   . Heart disease Neg Hx   . Diabetes Neg Hx   . Stroke Neg Hx     Social History   Tobacco Use  . Smoking status: Never Smoker  . Smokeless tobacco: Never Used  Vaping Use  . Vaping Use: Never used  Substance Use Topics  . Alcohol use: Yes    Comment: once a year  . Drug use: No    Home Medications Prior to Admission medications   Medication Sig Start Date End Date Taking? Authorizing Provider  ibuprofen (ADVIL,MOTRIN) 600 MG tablet Take 1 tablet (600 mg total) by mouth every 6 (six) hours as needed. Patient not taking: Reported on 06/07/2020 01/17/18   Fayrene Helper, PA-C    Allergies    Other  Review of Systems   Review of Systems  Constitutional: Negative for fever.  HENT: Negative for ear pain and sore throat.   Eyes: Negative for visual disturbance.  Respiratory: Negative for cough and shortness of breath.   Cardiovascular: Negative for chest pain.  Gastrointestinal: Negative for abdominal pain, constipation, diarrhea, nausea and vomiting.  Genitourinary: Negative for dysuria and hematuria.  Musculoskeletal: Negative for back pain.  Skin: Negative for rash.  Neurological:  Positive for headaches. Negative for dizziness, weakness, light-headedness and numbness.  Psychiatric/Behavioral: Positive for suicidal ideas.       No HI or AVH  All other systems reviewed and are negative.   Physical Exam Updated Vital Signs BP 121/83 (BP Location: Left Arm)   Pulse 81   Temp 98.8 F (37.1 C) (Oral)   Resp 16   Ht 5\' 5"  (1.651 m)   Wt 79.4 kg   SpO2 99%   BMI 29.12 kg/m   Physical Exam Vitals and nursing note reviewed.  Constitutional:      Appearance: He is well-developed.  HENT:     Head: Normocephalic and atraumatic.  Eyes:      Conjunctiva/sclera: Conjunctivae normal.  Cardiovascular:     Rate and Rhythm: Normal rate and regular rhythm.     Heart sounds: No murmur heard.   Pulmonary:     Effort: Pulmonary effort is normal. No respiratory distress.     Breath sounds: Normal breath sounds.  Abdominal:     Palpations: Abdomen is soft.     Tenderness: There is no abdominal tenderness.  Musculoskeletal:     Cervical back: Neck supple.  Skin:    General: Skin is warm and dry.  Neurological:     Mental Status: He is alert.     Comments: Clear speech, no facial droop     ED Results / Procedures / Treatments   Labs (all labs ordered are listed, but only abnormal results are displayed) Labs Reviewed  COMPREHENSIVE METABOLIC PANEL - Abnormal; Notable for the following components:      Result Value   Potassium 3.4 (*)    Total Bilirubin 2.1 (*)    All other components within normal limits  SALICYLATE LEVEL - Abnormal; Notable for the following components:   Salicylate Lvl <7.0 (*)    All other components within normal limits  ACETAMINOPHEN LEVEL - Abnormal; Notable for the following components:   Acetaminophen (Tylenol), Serum <10 (*)    All other components within normal limits  SARS CORONAVIRUS 2 BY RT PCR (HOSPITAL ORDER, PERFORMED IN [facility])  ETHANOL  CBC  RAPID URINE DRUG SCREEN, HOSP PERFORMED    EKG None  Radiology No results found.  Procedures Procedures (including critical care time)  Medications Ordered in ED Medications  acetaminophen (TYLENOL) tablet 650 mg (650 mg Oral Given 06/07/20 1305)    ED Course  I have reviewed the triage vital signs and the nursing notes.  Pertinent labs & imaging results that were available during my care of the patient were reviewed by me and considered in my medical decision making (see chart for details).    MDM Rules/Calculators/A&P                          Pt presenting for eval of SI and depression. Denies plan. C/o headache,  neuro intact. No other complaints.   Reviewed/interpreted labs CBC, CMP grossly normal.  EtOH negative.  Acetaminophen salicylate levels negative.  Patient appropriate for TTS eval at this time.  Pt evaluated by TTS who recommends inpatient admission.   The patient has been placed in psychiatric observation due to the need to provide a safe environment for the patient while obtaining psychiatric consultation and evaluation, as well as ongoing medical and medication management to treat the patient's condition.  The patient has not been placed under full IVC at this time.  Final Clinical Impression(s) / ED Diagnoses  Final diagnoses:  Suicidal ideation    Rx / DC Orders ED Discharge Orders    None       Karrie Meres, PA-C 06/07/20 1513    Alvira Monday, MD 06/07/20 1816

## 2020-06-08 ENCOUNTER — Inpatient Hospital Stay (HOSPITAL_COMMUNITY)
Admission: AD | Admit: 2020-06-08 | Discharge: 2020-06-14 | DRG: 885 | Disposition: A | Discharge status: Home / Self Care | Payer: BC Managed Care – PPO | Source: Intra-hospital | Attending: Psychiatry | Admitting: Psychiatry

## 2020-06-08 ENCOUNTER — Encounter (HOSPITAL_COMMUNITY): Payer: Self-pay | Admitting: Psychiatry

## 2020-06-08 ENCOUNTER — Other Ambulatory Visit: Payer: Self-pay

## 2020-06-08 DIAGNOSIS — G4700 Insomnia, unspecified: Secondary | ICD-10-CM | POA: Diagnosis present

## 2020-06-08 DIAGNOSIS — R45851 Suicidal ideations: Secondary | ICD-10-CM | POA: Diagnosis present

## 2020-06-08 DIAGNOSIS — F332 Major depressive disorder, recurrent severe without psychotic features: Secondary | ICD-10-CM | POA: Diagnosis not present

## 2020-06-08 DIAGNOSIS — F419 Anxiety disorder, unspecified: Secondary | ICD-10-CM | POA: Diagnosis not present

## 2020-06-08 DIAGNOSIS — Z20822 Contact with and (suspected) exposure to covid-19: Secondary | ICD-10-CM | POA: Diagnosis present

## 2020-06-08 DIAGNOSIS — F339 Major depressive disorder, recurrent, unspecified: Secondary | ICD-10-CM | POA: Diagnosis present

## 2020-06-08 MED ORDER — ENSURE ENLIVE PO LIQD
237.0000 mL | Freq: Two times a day (BID) | ORAL | Status: DC
  Administered 2020-06-08 – 2020-06-09 (×3): 237 mL via ORAL

## 2020-06-08 MED ORDER — ALUM & MAG HYDROXIDE-SIMETH 200-200-20 MG/5ML PO SUSP: 30.0000 mL | ORAL | Status: DC | PRN

## 2020-06-08 MED ORDER — MAGNESIUM HYDROXIDE 400 MG/5ML PO SUSP: 30.0000 mL | Freq: Every day | ORAL | Status: DC | PRN

## 2020-06-08 MED ORDER — ACETAMINOPHEN 325 MG PO TABS
650.0000 mg | ORAL_TABLET | Freq: Four times a day (QID) | ORAL | Status: DC | PRN
  Administered 2020-06-10 – 2020-06-13 (×2): 650 mg via ORAL
  Filled 2020-06-08 (×2): qty 2

## 2020-06-08 MED ORDER — HYDROXYZINE HCL 25 MG PO TABS
25.0000 mg | ORAL_TABLET | Freq: Three times a day (TID) | ORAL | Status: DC | PRN
  Administered 2020-06-08: 25 mg via ORAL
  Filled 2020-06-08: qty 1

## 2020-06-08 MED ORDER — ACETAMINOPHEN 325 MG PO TABS
650.0000 mg | ORAL_TABLET | Freq: Four times a day (QID) | ORAL | Status: DC | PRN
  Administered 2020-06-08: 650 mg via ORAL
  Filled 2020-06-08: qty 2

## 2020-06-08 MED ORDER — TRAZODONE HCL 50 MG PO TABS
50.0000 mg | ORAL_TABLET | Freq: Every evening | ORAL | Status: DC | PRN
  Administered 2020-06-08 – 2020-06-09 (×2): 50 mg via ORAL
  Filled 2020-06-08 (×2): qty 1

## 2020-06-08 NOTE — ED Notes (Signed)
Paged safe transport for transportation

## 2020-06-08 NOTE — Progress Notes (Signed)
Pt accepted to [facility], bed 301-1     NP is the accepting provider.    Dr. Jama Flavors is the attending provider.    Call report to [phone]    [facility] @ [facility] ED notified.     Pt is voluntary and will be transported by [employer]  Pt is scheduled to arrive at [facility] as soon as transportation can be arranged.    Wells Guiles, LCSW, LCAS Disposition CSW [facility]/TTS [phone]

## 2020-06-08 NOTE — ED Notes (Signed)
Breakfast ordered 
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
## 2020-06-08 NOTE — [facility] Group Notes (Signed)
Occupational Therapy Group Note Date: 06/08/2020 Group Topic/Focus: Stress Management  Group Description: Group encouraged increased participation and engagement through discussion focused on topic of stress management. Patients engaged interactively to discuss components of stress including physical signs, emotional signs, negative management strategies, and positive management strategies. Each individual identified one new stress management strategy they would like to try moving forward.  Participation Level: Moderate   Participation Quality: Moderate Cues   Behavior: Guarded and Withdrawn   Speech/Thought Process: Barely audible   Affect/Mood: Anxious, Depressed and Sad   Insight: Limited   Judgement: Limited   Individualization: Victor Stevens required moderate cues for participation in group discussion - pt appeared withdrawn, anxious, and depressed. Pt was observed with his head in his hands, looking down at his lap. When verbally engaged, pt was soft spoken, though relevant and appropriate. Pt declined to identify a healthy stress management strategy, though recognized that he is "under a lot" (of stress) right now.   Modes of Intervention: Activity, Discussion and Education  Patient Response to Interventions:  Attentive, Disengaged and Engaged   Plan: Continue to engage patient in OT groups 2 - 3x/week.  Donne Hazel, MOT, OTR/L

## 2020-06-08 NOTE — ED Notes (Signed)
PT transferred to [facility] at this time via safe transport. Pt wearing glasses, burgundy scrubs. 1 patient belonging bag sent with patient. Shoes, iphone, and apple watch in bag with clothes.

## 2020-06-08 NOTE — Progress Notes (Signed)
   06/08/20 2235  COVID-19 Daily Checkoff  Have you had a fever (temp > 37.80C/100F)  in the past 24 hours?  No  If you have had runny nose, nasal congestion, sneezing in the past 24 hours, has it worsened? No  COVID-19 EXPOSURE  Have you traveled outside the state in the past 14 days? No  Have you been in contact with someone with a confirmed diagnosis of COVID-19 or PUI in the past 14 days without wearing appropriate PPE? No  Have you been living in the same home as a person with confirmed diagnosis of COVID-19 or a PUI (household contact)? No  Have you been diagnosed with COVID-19? No   
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
## 2020-06-08 NOTE — ED Notes (Signed)
Lunch Tray Ordered @ 1049. °

## 2020-06-08 NOTE — ED Provider Notes (Signed)
Emergency Medicine Observation Re-evaluation Note  Victor Stevens is a 34 y.o. male, seen on rounds today.  Pt initially presented to the ED for complaints of Suicidal Currently, the patient is resting comfortably in the ED bed.  Physical Exam  BP 131/88 (BP Location: Left Arm)   Pulse 73   Temp 98 F (36.7 C) (Oral)   Resp 18   Ht 5\' 5"  (1.651 m)   Wt 79.4 kg   SpO2 99%   BMI 29.12 kg/m  Physical Exam Vitals reviewed.  Constitutional:      Appearance: Normal appearance.  HENT:     Head: Normocephalic and atraumatic.  Eyes:     General:        Right eye: No discharge.        Left eye: No discharge.     Extraocular Movements: Extraocular movements intact.     Conjunctiva/sclera: Conjunctivae normal.  Musculoskeletal:        General: No swelling. Normal range of motion.  Neurological:     General: No focal deficit present.     Mental Status: He is alert and oriented to person, place, and time.  Psychiatric:        Mood and Affect: Mood normal.        Behavior: Behavior normal.     ED Course / MDM  EKG:    I have reviewed the labs performed to date as well as medications administered while in observation.  Recent changes in the last 24 hours include none. Plan  Current plan is for accepted to [facility] behavioral health.  Patient to be transferred later today. Patient is not under full IVC at this time.   [facility], PA-C 06/08/20 1143    08/08/20, MD 06/09/20 1515

## 2020-06-08 NOTE — Progress Notes (Signed)
Adult Psychoeducational Group Note  Date:  06/08/2020 Time:  9:36 PM  Group Topic/Focus:  Wrap-Up Group:   The focus of this group is to help patients review their daily goal of treatment and discuss progress on daily workbooks.  Participation Level:  Active  Participation Quality:  Appropriate  Affect:  Appropriate  Cognitive:  Appropriate  Insight: Appropriate  Engagement in Group:  Engaged  Modes of Intervention:  Discussion  Additional Comments:pt attend wrap up group. His day was a 2. His goal to get out of the hospital. Pt said he achieve his goal. Pt said he want to get over this feeling unless he get accept whole family.     Charna Busman Long 06/08/2020, 9:36 PM

## 2020-06-09 DIAGNOSIS — F332 Major depressive disorder, recurrent severe without psychotic features: Principal | ICD-10-CM

## 2020-06-09 MED ORDER — LORAZEPAM 0.5 MG PO TABS
0.5000 mg | ORAL_TABLET | Freq: Four times a day (QID) | ORAL | Status: DC | PRN
  Administered 2020-06-11: 0.5 mg via ORAL
  Filled 2020-06-09: qty 1

## 2020-06-09 NOTE — Progress Notes (Signed)
   06/09/20 2220  COVID-19 Daily Checkoff  Have you had a fever (temp > 37.80C/100F)  in the past 24 hours?  No  COVID-19 EXPOSURE  Have you traveled outside the state in the past 14 days? No  Have you been in contact with someone with a confirmed diagnosis of COVID-19 or PUI in the past 14 days without wearing appropriate PPE? No  Have you been living in the same home as a person with confirmed diagnosis of COVID-19 or a PUI (household contact)? No  Have you been diagnosed with COVID-19? No

## 2020-06-09 NOTE — Progress Notes (Signed)
   06/08/20 2100  Psych Admission Type (Psych Patients Only)  Admission Status Voluntary  Psychosocial Assessment  Patient Complaints Anxiety;Insomnia;Depression  Eye Contact Avertive;Brief  Facial Expression Anxious;Masked;Sullen;Sad;Worried  Affect Anxious;Depressed;Sad;Sullen  Speech Logical/coherent  Interaction Assertive  Motor Activity Restless;Slow  Appearance/Hygiene In scrubs  Behavior Characteristics Appropriate to situation;Guarded  Mood Anxious;Sad  Thought Process  Coherency WDL  Content Blaming others;Blaming self  Delusions None reported or observed  Perception WDL  Hallucination None reported or observed  Judgment Poor  Confusion None  Danger to Self  Current suicidal ideation? Denies  Danger to Others  Danger to Others None reported or observed  Pt c/o anxiety  and insomnia at bed time Prn Vistaril 25mg  and Trazadone 50mg  PO given and effective.

## 2020-06-09 NOTE — Progress Notes (Signed)
   06/09/20 2220  Psych Admission Type (Psych Patients Only)  Admission Status Voluntary  Psychosocial Assessment  Patient Complaints Insomnia;Depression  Eye Contact Brief  Facial Expression Sad;Masked  Affect Depressed;Sad  Speech Logical/coherent  Interaction Assertive  Motor Activity Other (Comment) (WNL)  Appearance/Hygiene In scrubs;Unremarkable  Behavior Characteristics Appropriate to situation;Cooperative;Calm  Mood Depressed;Sad  Thought Process  Coherency WDL  Content WDL  Delusions None reported or observed  Perception WDL  Hallucination None reported or observed  Judgment Poor  Confusion None  Danger to Self  Current suicidal ideation? Denies  Danger to Others  Danger to Others None reported or observed

## 2020-06-09 NOTE — Progress Notes (Signed)
Recreation Therapy Notes  Date: 8.6.21 Time: 0930 Location: [address] Dayroom  Group Topic: Stress Management  Goal Area(s) Addresses:  Patient will identify positive stress management techniques. Patient will identify benefits of using stress management post d/c.  Intervention: Stress Management  Activity: Guided Imagery.  LRT read a script that guided patients to envision their peaceful place.  Patients were to listen and follow along as script was read to engage in activity.    Education:  Stress Management, Discharge Planning.   Education Outcome: Acknowledges Education  Clinical Observations/Feedback: Pt did not attend group activity.    Derwin Reddy, LRT/CTRS         Eitan Doubleday A 06/09/2020 12:04 PM 

## 2020-06-09 NOTE — Progress Notes (Signed)
[place] NOVEL CORONAVIRUS (COVID-19) DAILY CHECK-OFF SYMPTOMS - answer yes or no to each - every day NO YES  Have you had a fever in the past 24 hours?  . Fever (Temp > 37.80C / 100F) X   Have you had any of these symptoms in the past 24 hours? . New Cough .  Sore Throat  .  Shortness of Breath .  Difficulty Breathing .  Unexplained Body Aches   X   Have you had any one of these symptoms in the past 24 hours not related to allergies?   . Runny Nose .  Nasal Congestion .  Sneezing   X   If you have had runny nose, nasal congestion, sneezing in the past 24 hours, has it worsened?  X   EXPOSURES - check yes or no X   Have you traveled outside the state in the past 14 days?  X   Have you been in contact with someone with a confirmed diagnosis of COVID-19 or PUI in the past 14 days without wearing appropriate PPE?  X   Have you been living in the same home as a person with confirmed diagnosis of COVID-19 or a PUI (household contact)?    X   Have you been diagnosed with COVID-19?    X              What to do next: Answered NO to all: Answered YES to anything:   Proceed with unit schedule Follow the [facility] Inpatient Flowsheet.   

## 2020-06-09 NOTE — H&P (Addendum)
Psychiatric Admission Assessment Adult  Patient Identification: Victor Stevens MRN:  [id number] Date of Evaluation:  06/09/2020 Chief Complaint:  " I guess I snapped " Principal Diagnosis:  MDD, no psychotic features  Diagnosis:  Active Problems:   Major depressive disorder, recurrent episode (HCC)  History of Present Illness: 34 year old male, presented to ED on 8/3 reporting having suicidal ideations following an argument with his wife. Explains he called his general  manager about this situation and  "I guess I said I no longer cared about life ", and was advised  to go to ED.  He states this occurred soon after having an argument with his wife . Explains that  his wife  recently found out he was being unfaithful, resulting in arguments / threats of separation and of  her taking their son with her. He also reports other stressors include stressful job and financial difficulties . States that prior to this event " I was doing all right". He reports he had some anxiety, " stress" due to his job, but states he was not feeling particularly depressed. He states that occasionally during episodes of increased stress he would have fleeting passive SI " like thinking I wish I were not alive", but denies having any suicidal plan or intention.  He does endorse some neuro-vegetative symptoms of depression, as below, which he states started following marital discord. Denies psychotic symptoms Admission BAL and UDS negative  Associated Signs/Symptoms: Depression Symptoms:  depressed mood, suicidal thoughts without plan, loss of energy/fatigue, decreased appetite, (Hypo) Manic Symptoms:  None noted or endorsed  Anxiety Symptoms:  Denies increased anxiety Psychotic Symptoms: None  PTSD Symptoms: Denies  Total Time spent with patient: 45 minutes  Past Psychiatric History: denies prior history of psychiatric admissions. Denies history of suicide attempts, denies history of self cutting . Denies history  of psychosis, denies history of mania or hypomania, denies history of PTSD. He does endorse past depressive episodes, most recently about 5 years ago , at the time related to break up with a GF. Denies panic or agoraphobia. Reports he was diagnosed with ADHD in the past . States he has never been treated with psychiatric medications in the past .   Is the patient at risk to self? Yes.    Has the patient been a risk to self in the past 6 months? Yes.    Has the patient been a risk to self within the distant past? No.  Is the patient a risk to others? No.  Has the patient been a risk to others in the past 6 months? No.  Has the patient been a risk to others within the distant past? No.   Prior Inpatient Therapy:  denies  Prior Outpatient Therapy:  none   Alcohol Screening: 1. How often do you have a drink containing alcohol?: Monthly or less 2. How many drinks containing alcohol do you have on a typical day when you are drinking?: 1 or 2 3. How often do you have six or more drinks on one occasion?: Never AUDIT-C Score: 1 4. How often during the last year have you found that you were not able to stop drinking once you had started?: Never 5. How often during the last year have you failed to do what was normally expected from you because of drinking?: Never 6. How often during the last year have you needed a first drink in the morning to get yourself going after a heavy drinking session?: Never 7.  How often during the last year have you had a feeling of guilt of remorse after drinking?: Never 8. How often during the last year have you been unable to remember what happened the night before because you had been drinking?: Never 9. Have you or someone else been injured as a result of your drinking?: No 10. Has a relative or friend or a doctor or another health worker been concerned about your drinking or suggested you cut down?: No Alcohol Use Disorder Identification Test Final Score (AUDIT):  1 Substance Abuse History in the last 12 months:  Denies alcohol or drug abuse  Consequences of Substance Abuse: Denies  Previous Psychotropic Medications: He reports he has not been treated with psychiatric medications in the past  Psychological Evaluations: No Past Medical History: denies medical history . NKDA. He was not taking any medications other than PRN NSAID prior to admission Past Medical History:  Diagnosis Date  . ADHD (attention deficit hyperactivity disorder)   . Eczema   . Headache    migraine  . Inguinal hernia    left  . Wears glasses     Past Surgical History:  Procedure Laterality Date  . INGUINAL HERNIA REPAIR Left 01/19/2020   Procedure: LEFT INGUINAL HERNIA REPAIR;  Surgeon: Andria Meuse, MD;  Location: [facility];  Service: General;  Laterality: Left;  . INSERTION OF MESH Left 01/19/2020   Procedure: INSERTION OF MESH;  Surgeon: Andria Meuse, MD;  Location: [facility];  Service: General;  Laterality: Left;  . NO PAST SURGERIES  11/2019  . ORCHIOPEXY Left 01/19/2020   Procedure: LEFT ORCHIOPEXY;  Surgeon: Heloise Purpura, MD;  Location: [facility];  Service: Urology;  Laterality: Left;   Family History: parents alive, has an older sister Family History  Problem Relation Age of Onset  . Depression Father   . Cancer Maternal Aunt   . Heart disease Neg Hx   . Diabetes Neg Hx   . Stroke Neg Hx    Family Psychiatric  History: reports grandmother had history of dementia. Sister has history of depression. History of alcohol use disorder in extended family Tobacco Screening:  does not smoke or vape Social History: married, has two year old daughter, works at a Scientific laboratory technician Social History   Substance and Sexual Activity  Alcohol Use Yes   Comment: once a year     Social History   Substance and Sexual Activity  Drug Use No    Additional Social History: Marital status: Married Number of  Years Married: 1 What types of issues is patient dealing with in the relationship?: patient was unfaithful to his wife Additional relationship information: wife is threatening to leave him Are you sexually active?: Yes What is your sexual orientation?: heterosexual Has your sexual activity been affected by drugs, alcohol, medication, or emotional stress?: N/A Does patient have children?: Yes How many children?: 1 How is patient's relationship with their children?: Patient is close to his child who is a toddler  Allergies:   Allergies  Allergen Reactions  . Other Other (See Comments)    Seafood makes me nauseous   Lab Results:  Results for orders placed or performed during the hospital encounter of 06/06/20 (from the past 48 hour(s))  SARS Coronavirus 2 by RT PCR (hospital order, performed in [facility] hospital lab) Nasopharyngeal Nasopharyngeal Swab     Status: None   Collection Time: 06/07/20  1:02 PM   Specimen: Nasopharyngeal Swab  Result  Value Ref Range   SARS Coronavirus 2 NEGATIVE NEGATIVE    Comment: (NOTE) SARS-CoV-2 target nucleic acids are NOT DETECTED.  The SARS-CoV-2 RNA is generally detectable in upper and lower respiratory specimens during the acute phase of infection. The lowest concentration of SARS-CoV-2 viral copies this assay can detect is 250 copies / mL. A negative result does not preclude SARS-CoV-2 infection and should not be used as the sole basis for treatment or other patient management decisions.  A negative result may occur with improper specimen collection / handling, submission of specimen other than nasopharyngeal swab, presence of viral mutation(s) within the areas targeted by this assay, and inadequate number of viral copies (<250 copies / mL). A negative result must be combined with clinical observations, patient history, and epidemiological information.  Fact Sheet for Patients:   BoilerBrush.com.cy  Fact Sheet for  Healthcare Providers: https://pope.com/  This test is not yet approved or  cleared by the [place] FDA and has been authorized for detection and/or diagnosis of SARS-CoV-2 by FDA under an Emergency Use Authorization (EUA).  This EUA will remain in effect (meaning this test can be used) for the duration of the COVID-19 declaration under Section 564(b)(1) of the Act, 21 U.S.C. section 360bbb-3(b)(1), unless the authorization is terminated or revoked sooner.  Performed at [facility], [address] [place], [place] [address]     Blood Alcohol level:  Lab Results  Component Value Date   ETH <10 06/07/2020    Metabolic Disorder Labs:  No results found for: HGBA1C, MPG No results found for: PROLACTIN Lab Results  Component Value Date   CHOL 133 11/26/2019   TRIG 87 11/26/2019   HDL 57 11/26/2019   CHOLHDL 2.3 11/26/2019   LDLCALC 59 11/26/2019    Current Medications: Current Facility-Administered Medications  Medication Dose Route Frequency Provider Last Rate Last Admin  . acetaminophen (TYLENOL) tablet 650 mg  650 mg Oral Q6H PRN Nwoko, Agnes I, NP      . alum & mag hydroxide-simeth (MAALOX/MYLANTA) 200-200-20 MG/5ML suspension 30 mL  30 mL Oral Q4H PRN Nwoko, Agnes I, NP      . feeding supplement (ENSURE ENLIVE) (ENSURE ENLIVE) liquid 237 mL  237 mL Oral BID BM Nwoko, Agnes I, NP   237 mL at 06/09/20 1033  . hydrOXYzine (ATARAX/VISTARIL) tablet 25 mg  25 mg Oral TID PRN Armandina Stammer I, NP   25 mg at 06/08/20 2141  . magnesium hydroxide (MILK OF MAGNESIA) suspension 30 mL  30 mL Oral Daily PRN Armandina Stammer I, NP      . traZODone (DESYREL) tablet 50 mg  50 mg Oral QHS PRN Armandina Stammer I, NP   50 mg at 06/08/20 2141   PTA Medications: Medications Prior to Admission  Medication Sig Dispense Refill Last Dose  . ibuprofen (ADVIL,MOTRIN) 600 MG tablet Take 1 tablet (600 mg total) by mouth every 6 (six) hours as needed. (Patient not taking:  Reported on 06/07/2020) 30 tablet 0     Musculoskeletal: Strength & Muscle Tone: within normal limits Gait & Station: normal Patient leans: N/A  Psychiatric Specialty Exam: Physical Exam  Review of Systems  Constitutional: Positive for appetite change.  HENT: Negative.   Eyes: Negative.   Respiratory: Negative.  Negative for cough and shortness of breath.   Cardiovascular: Negative.  Negative for chest pain.  Gastrointestinal: Negative.  Negative for nausea and vomiting.  Endocrine: Negative.   Genitourinary: Negative.   Musculoskeletal: Negative.  Skin: Negative for rash.  Allergic/Immunologic: Negative.   Neurological: Positive for headaches. Negative for seizures.       Reports history of headaches    Psychiatric/Behavioral:       Depression      Blood pressure (!) 117/92, pulse 89, temperature 98.1 F (36.7 C), temperature source Oral, resp. rate 18, height 5\' 5"  (1.651 m), weight 78.5 kg, SpO2 99 %.Body mass index is 28.79 kg/m.  General Appearance: Fairly Groomed  Eye Contact:  Fair  Speech:  Normal Rate  Volume:  Decreased  Mood:  presents depressed, with constricted affect   Affect:  Congruent  Thought Process:  Linear and Descriptions of Associations: Intact  Orientation:  Other:  fully alert and attentive  Thought Content:  no hallucinations, no delusions, not internally preoccupied   Suicidal Thoughts:  No denies current suicidal or self injurious ideations and contracts for safety on unit, denies any homicidal or violent ideations  Homicidal Thoughts:  No  Memory:  recent and remote grossly intact   Judgement:  Fair  Insight:  Fair  Psychomotor Activity:  Normal  Concentration:  Concentration: Good and Attention Span: Good  Recall:  Good  Fund of Knowledge:  Good  Language:  Good  Akathisia:  Negative  Handed:  Right  AIMS (if indicated):     Assets:  Communication Skills Desire for Improvement Resilience  ADL's:  Intact  Cognition:  WNL  Sleep:   Number of Hours: 6.5    Treatment Plan Summary: Daily contact with patient to assess and evaluate symptoms and progress in treatment, Medication management, Plan inpatient treatment  and medications as below  Observation Level/Precautions:  15 minute checks  Laboratory:  as needed- Total Bili noted to be mildly elevated. Does not endorse any associated symptoms or known history of liver disease. K+ slightly low on admission and reports decreased PO intake over recent days . Will recheck BMP and hepatic function test   Psychotherapy:  Milieu, group therapy   Medications:  We discussed medication options. Currently he states he prefers not to start antidepressant or other standing psychiatric medication . States he prefers to address via  psychotherapy   Consultations:  As needed   Discharge Concerns:  -  Estimated LOS: 3-4 days   Other:     Physician Treatment Plan for Primary Diagnosis: MDD Long Term Goal(s): Improvement in symptoms so as ready for discharge  Short Term Goals: Ability to identify changes in lifestyle to reduce recurrence of condition will improve, Ability to verbalize feelings will improve, Ability to disclose and discuss suicidal ideas, Ability to demonstrate self-control will improve, Ability to identify and develop effective coping behaviors will improve, Ability to maintain clinical measurements within normal limits will improve and Compliance with prescribed medications will improve  Physician Treatment Plan for Secondary Diagnosis: Active Problems:   Major depressive disorder, recurrent episode (HCC)  Long Term Goal(s): Improvement in symptoms so as ready for discharge  Short Term Goals: Ability to identify changes in lifestyle to reduce recurrence of condition will improve, Ability to verbalize feelings will improve, Ability to disclose and discuss suicidal ideas, Ability to demonstrate self-control will improve, Ability to identify and develop effective coping  behaviors will improve and Ability to maintain clinical measurements within normal limits will improve  I certify that inpatient services furnished can reasonably be expected to improve the patient's condition.    , MD 8/6/202112:27 PM
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
## 2020-06-10 LAB — HEPATIC FUNCTION PANEL
ALT: 20 U/L (ref 0–44)
AST: 17 U/L (ref 15–41)
Albumin: 4.4 g/dL (ref 3.5–5.0)
Alkaline Phosphatase: 60 U/L (ref 38–126)
Bilirubin, Direct: 0.2 mg/dL (ref 0.0–0.2)
Indirect Bilirubin: 1.6 mg/dL — ABNORMAL HIGH (ref 0.3–0.9)
Total Bilirubin: 1.8 mg/dL — ABNORMAL HIGH (ref 0.3–1.2)
Total Protein: 7.5 g/dL (ref 6.5–8.1)

## 2020-06-10 LAB — BASIC METABOLIC PANEL
Anion gap: 13 (ref 5–15)
BUN: 17 mg/dL (ref 6–20)
CO2: 24 mmol/L (ref 22–32)
Calcium: 9 mg/dL (ref 8.9–10.3)
Chloride: 102 mmol/L (ref 98–111)
Creatinine, Ser: 1.27 mg/dL — ABNORMAL HIGH (ref 0.61–1.24)
GFR calc Af Amer: >60 mL/min (ref >60)
GFR calc non Af Amer: >60 mL/min (ref >60)
Glucose, Bld: 113 mg/dL — ABNORMAL HIGH (ref 70–99)
Potassium: 3.7 mmol/L (ref 3.5–5.1)
Sodium: 139 mmol/L (ref 135–145)

## 2020-06-10 MED ORDER — MIRTAZAPINE 7.5 MG PO TABS
7.5000 mg | ORAL_TABLET | Freq: Every day | ORAL | Status: DC
  Administered 2020-06-10: 7.5 mg via ORAL
  Filled 2020-06-10 (×3): qty 1

## 2020-06-10 MED ORDER — TRAZODONE HCL 100 MG PO TABS: 100.0000 mg | ORAL_TABLET | Freq: Every evening | ORAL | Status: DC | PRN

## 2020-06-10 MED ORDER — DOCUSATE SODIUM 100 MG PO CAPS
100.0000 mg | ORAL_CAPSULE | Freq: Two times a day (BID) | ORAL | Status: DC | PRN
  Administered 2020-06-10: 100 mg via ORAL
  Filled 2020-06-10: qty 1

## 2020-06-10 NOTE — Progress Notes (Signed)
Adult Psychoeducational Group Note  Date:  06/10/2020 Time:  10:49 AM  Group Topic/Focus:  Recovery Goals:   The focus of this group is to identify appropriate goals for recovery and establish a plan to achieve them.  Participation Level:  Active  Participation Quality:  Appropriate  Affect:  Appropriate  Cognitive:  Appropriate  Insight: Appropriate  Engagement in Group:  Engaged  Modes of Intervention:  Discussion  Additional Comments:  Patient attended Goal and Boundary Setting group and participated.   Jhoel Stieg W Koree Staheli 06/10/2020, 10:49 AM

## 2020-06-10 NOTE — Progress Notes (Signed)
[facility] MD Progress Note  06/10/2020 1:52 PM Victor Stevens  MRN:  [id number] Subjective: Patient reports he is "feeling about the same".  Denies suicidal ideations.  Denies medication side effects thus far.  Reports some ongoing insomnia. Objective: Have discussed case with treatment team and met with patient. 26 y old male, presented voluntarily reporting depression, neuro-vegetative symptoms, and passive SI. Endorses recent marital stressors related to infidelity  , stating his wife has threatened separation and taking his son . Reports past history of fleeing passive SI during episodes of severe stress. No prior psychiatric admissions and no history of suicide attempts in the past .  Was not taking any medications prior to admission. Today presents depression, with constricted affect, but denies suicidal ideations.   Today patient presents alert, attentive, calm, without psychomotor agitation. He remains vaguely depressed/constricted in affect.  Denies suicidal ideations. He remains ruminative regarding marital stressors although today states wife has presented supportive and caring during their phone communications. No disruptive or agitated behaviors on unit. Labs reviewed-BMP unremarkable (creatinine 1.27, BUN 17, GFR more than 60, creatinine clearance 79.2).  Bilirubin has decreased from 2.1 to 1.8.   Principal Problem: Depression Diagnosis: Active Problems:   Major depressive disorder, recurrent episode (Protivin)  Total Time spent with patient: 20 minutes  Past Psychiatric History:   Past Medical History:  Past Medical History:  Diagnosis Date  . ADHD (attention deficit hyperactivity disorder)   . Eczema   . Headache    migraine  . Inguinal hernia    left  . Wears glasses     Past Surgical History:  Procedure Laterality Date  . INGUINAL HERNIA REPAIR Left 01/19/2020   Procedure: LEFT INGUINAL HERNIA REPAIR;  Surgeon: Ileana Roup, MD;  Location: [facility];  Service: General;  Laterality: Left;  . INSERTION OF MESH Left 01/19/2020   Procedure: INSERTION OF MESH;  Surgeon: Ileana Roup, MD;  Location: [facility];  Service: General;  Laterality: Left;  . NO PAST SURGERIES  11/2019  . ORCHIOPEXY Left 01/19/2020   Procedure: LEFT ORCHIOPEXY;  Surgeon: Raynelle Bring, MD;  Location[facility];  Service: Urology;  Laterality: Left;   Family History:  Family History  Problem Relation Age of Onset  . Depression Father   . Cancer Maternal Aunt   . Heart disease Neg Hx   . Diabetes Neg Hx   . Stroke Neg Hx    Family Psychiatric  History:  Social History:  Social History   Substance and Sexual Activity  Alcohol Use Yes   Comment: once a year     Social History   Substance and Sexual Activity  Drug Use No    Social History   Socioeconomic History  . Marital status: Married    Spouse name: Not on file  . Number of children: Not on file  . Years of education: Not on file  . Highest education level: Not on file  Occupational History  . Not on file  Tobacco Use  . Smoking status: Never Smoker  . Smokeless tobacco: Never Used  Vaping Use  . Vaping Use: Never used  Substance and Sexual Activity  . Alcohol use: Yes    Comment: once a year  . Drug use: No  . Sexual activity: Yes    Birth control/protection: None  Other Topics Concern  . 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 Difficulty of Paying Living Expenses:   Food Insecurity:   . Worried About Charity fundraiser in the Last Year:   . Arboriculturist in the Last Year:   Transportation Needs:   . Film/video editor (Medical):   Marland Kitchen Lack of Transportation (Non-Medical):   Physical Activity:   . Days of Exercise per  Week:   . Minutes of Exercise per Session:   Stress:   . Feeling of Stress :   Social Connections:   . Frequency of Communication with Friends and Family:   . Frequency of Social Gatherings with Friends and Family:   . Attends Religious Services:   . Active Member of Clubs or Organizations:   . Attends Archivist Meetings:   Marland Kitchen Marital Status:    Additional Social History:   Sleep: Fair  Appetite:  Fair  Current Medications: Current Facility-Administered Medications  Medication Dose Route Frequency Provider Last Rate Last Admin  . acetaminophen (TYLENOL) tablet 650 mg  650 mg Oral Q6H PRN Nwoko, Agnes I, NP      . docusate sodium (COLACE) capsule 100 mg  100 mg Oral BID PRN Sofija Antwi, Myer Peer, MD      . feeding supplement (ENSURE ENLIVE) (ENSURE ENLIVE) liquid 237 mL  237 mL Oral BID BM Nwoko, Agnes I, NP   237 mL at 06/09/20 1514  . LORazepam (ATIVAN) tablet 0.5 mg  0.5 mg Oral Q6H PRN Liberato Stansbery, Myer Peer, MD      . mirtazapine (REMERON) tablet 7.5 mg  7.5 mg Oral QHS Salvador Bigbee, Myer Peer, MD        Lab Results:  Results for orders placed or performed during the hospital encounter of 06/08/20 (from the past 48 hour(s))  Basic metabolic panel     Status: Abnormal   Collection Time: 06/10/20  6:24 AM  Result Value Ref Range   Sodium 139 135 - 145 mmol/L   Potassium 3.7 3.5 - 5.1 mmol/L   Chloride 102 98 - 111 mmol/L   CO2 24 22 - 32 mmol/L   Glucose, Bld 113 (H) 70 - 99 mg/dL    Comment: Glucose reference range applies only to samples taken after fasting for at least 8 hours.   BUN 17 6 - 20 mg/dL   Creatinine, Ser 1.27 (H) 0.61 - 1.24 mg/dL   Calcium 9.0 8.9 - 10.3 mg/dL   GFR calc non Af Amer >60 >60 mL/min   GFR calc Af Amer >60 >60 mL/min   Anion gap 13 5 - 15    Comment: Performed at [facility], [place] [address], [place], [place] [address]  Hepatic function panel     Status: Abnormal   Collection Time: 06/10/20  6:24 AM  Result Value Ref Range    Total Protein 7.5 6.5 - 8.1 g/dL   Albumin 4.4 3.5 - 5.0 g/dL   AST 17 15 - 41 U/L   ALT 20 0 - 44 U/L   Alkaline Phosphatase 60 38 - 126 U/L   Total Bilirubin 1.8 (H) 0.3 - 1.2 mg/dL   Bilirubin, Direct 0.2 0.0 - 0.2 mg/dL   Indirect Bilirubin 1.6 (H) 0.3 - 0.9 mg/dL    Comment: Performed at [facility], [place] [address], [place], [place] [address]    Blood Alcohol level:  Lab Results  Component Value Date   ETH <10 74/25/9563    Metabolic Disorder Labs: No results found for: HGBA1C, MPG No results found for: PROLACTIN Lab Results  Component Value Date   CHOL 133 11/26/2019   TRIG 87 11/26/2019   HDL 57 11/26/2019   CHOLHDL 2.3 11/26/2019   LDLCALC 59 11/26/2019    Physical Findings: AIMS: Facial and Oral Movements Muscles of Facial Expression: None, normal Lips and Perioral Area: None, normal Jaw: None, normal Tongue: None, normal,Extremity Movements Upper (arms, wrists, hands, fingers): None, normal Lower (legs, knees, ankles, toes): None, normal, Trunk Movements Neck, shoulders, hips: None, normal, Overall Severity Severity of abnormal movements (highest score from questions above): None, normal Incapacitation due to abnormal movements: None, normal Patient's awareness of abnormal movements (rate only patient's report): No Awareness, Dental Status Current problems with teeth and/or dentures?: No Does patient usually wear dentures?: No  CIWA:    COWS:     Musculoskeletal: Strength & Muscle Tone: within normal limits Gait & Station: normal Patient leans: N/A  Psychiatric Specialty Exam: Physical Exam  Review of Systems no headache, no chest pain, no shortness of breath, no nausea, no vomiting, reports constipation  Blood pressure 117/74, pulse 89, temperature 97.9 F (36.6 C), temperature source Oral, resp. rate 18, height 5' 5"  (1.651 m), weight 78.5 kg, SpO2 98 %.Body mass index is 28.79 kg/m.  General Appearance: Fairly groomed  Eye  Contact:  Good  Speech:  Normal Rate  Volume:  Decreased  Mood:  Depressed  Affect:  Remains constricted   Thought Process:  Linear and Descriptions of Associations: Intact  Orientation:  Full (Time, Place, and Person)  Thought Content:  No hallucinations, no delusions, not internally preoccupied  Suicidal Thoughts:  No denies suicidal or self-injurious ideations at this time, and contracts for safety on unit  Homicidal Thoughts:  No denies any homicidal or violent ideations  Memory:  Recent and remote grossly intact  Judgement:  Other:  Improving  Insight:  Fair/improving  Psychomotor Activity:  Normal-no psychomotor agitation or restlessness  Concentration:  Concentration: Good and Attention Span: Good  Recall:  Good  Fund of Knowledge:  Good  Language:  Good  Akathisia:  Negative  Handed:  Right  AIMS (if indicated):     Assets:  Communication Skills Desire for Improvement Resilience  ADL's:  Intact  Cognition:  WNL  Sleep:  Number of Hours: 5.5   Assessment:  67 y old male, presented voluntarily reporting depression, neuro-vegetative symptoms, and passive SI. Endorses recent marital stressors related to infidelity  , stating his wife has threatened separation and taking his son . Reports past history of fleeing passive SI during episodes of severe stress. No prior psychiatric admissions and no history of suicide attempts in the past .  Was not taking any medications prior to admission.  Today patient remains depressed with a vaguely constricted affect.  Denies suicidal ideations.  He continues to be pensive about marital conflict, although states that he has had positive telephone interactions with his wife since admission.  Sleep is fair.  On admission patient had declined starting an antidepressant.  Today he is agreeing to start Remeron for depression, insomnia (may also help improve his appetite which has been limited).  Side effects reviewed.  Treatment Plan  Summary: Daily contact with patient to assess and evaluate symptoms and progress in treatment, Medication management, Plan Inpatient treatment and Medication as below Encourage group and milieu participation Continue Ativan 0.5 mg every 6 hours as  needed for anxiety Start Remeron 7.5 mg nightly for depression, insomnia Start Docusate 100 mg twice daily as needed for constipation  Treatment team working on disposition planning options Jenne Campus, MD 06/10/2020, 1:52 PM

## 2020-06-10 NOTE — Progress Notes (Signed)
   06/10/20 2015  COVID-19 Daily Checkoff  Have you had a fever (temp > 37.80C/100F)  in the past 24 hours?  No  COVID-19 EXPOSURE  Have you traveled outside the state in the past 14 days? No  Have you been in contact with someone with a confirmed diagnosis of COVID-19 or PUI in the past 14 days without wearing appropriate PPE? No  Have you been living in the same home as a person with confirmed diagnosis of COVID-19 or a PUI (household contact)? No  Have you been diagnosed with COVID-19? No

## 2020-06-10 NOTE — Progress Notes (Signed)
Pt reports ongoing depression and anxiety. He denies SI/HI. He reports difficulty staying asleep at bedtime but denies having nightmares and racing thoughts. He stated that he Trazodone was not effective and that he wasn't able to get the second dose because it was after hours. He has been visible on the unit and attending groups. He has been compliant with taking medications and denies any side effects.   Orders reviewed with pt. Vital signs reviewed. Verbal support provided. Pt encouraged to attend groups. 15 minute checks performed for safety.   Pt receptive to treatment plan.

## 2020-06-10 NOTE — Progress Notes (Signed)
Adult Psychoeducational Wrap-up Group Note     Group Topic/Focus:  Goals Group:   The focus of this group is to help patients review daily goals and to reflect on how goals were implemented throughout the day and the effect they had. In addition, the goal was to revise goals if needed and to discuss how the patient can incorporate goal setting into their daily lives to aide in recovery.   Participation Level:  Active   Participation Quality:  Appropriate   Affect:  Appropriate   Cognitive:  Appropriate   Insight: Appropriate   Engagement in Group:  Engaged   Modes of Intervention:  Discussion   Additional Comments:  Patient rated today a 5 and said that goal was NOT achieved.

## 2020-06-10 NOTE — Progress Notes (Signed)
Pt reports that his wife is still upset at him for cheating on her. He's been having phone conversations with her and he identifies one of his stressors being that she keeps asking him questions about what happened. Pt reports his wife saying "Oh I'm leaving and I'm taking the son" repeatedly. This causes him stress and a negative mindset, but he still does want to be with his wife. Pt encouraged to consider family counseling. Pt reports he had trouble sleeping last night. Pt informed that he will be starting remeron tonight for the depression and insomnia. Pt denies SI/HI and AVH. Active listening, reassurance, and support provided. Q 15 min safety checks continue.     06/10/20 2015  Psych Admission Type (Psych Patients Only)  Admission Status Voluntary  Psychosocial Assessment  Patient Complaints Depression;Sadness;Insomnia;Worrying  Eye Contact Fair  Facial Expression Other (Comment) (appropriate)  Affect Appropriate to circumstance;Depressed  Speech Logical/coherent  Interaction Assertive  Motor Activity Other (Comment) (WNL)  Appearance/Hygiene Unremarkable  Behavior Characteristics Cooperative;Appropriate to situation;Calm  Mood Depressed;Pleasant  Thought Process  Coherency WDL  Content WDL  Delusions None reported or observed  Perception WDL  Hallucination None reported or observed  Judgment Poor  Confusion None  Danger to Self  Current suicidal ideation? Denies  Danger to Others  Danger to Others None reported or observed

## 2020-06-11 MED ORDER — MIRTAZAPINE 15 MG PO TABS
15.0000 mg | ORAL_TABLET | Freq: Every day | ORAL | Status: DC
  Administered 2020-06-11: 15 mg via ORAL
  Filled 2020-06-11 (×3): qty 1

## 2020-06-11 NOTE — Progress Notes (Signed)
   06/11/20 2153  Psych Admission Type (Psych Patients Only)  Admission Status Voluntary  Psychosocial Assessment  Patient Complaints Insomnia  Eye Contact Fair  Facial Expression Flat  Affect Appropriate to circumstance  Speech Logical/coherent  Interaction Assertive  Motor Activity Other (Comment) (WDL)  Appearance/Hygiene Unremarkable  Behavior Characteristics Appropriate to situation  Mood Depressed;Pleasant  Thought Process  Coherency WDL  Content WDL  Delusions None reported or observed  Perception WDL  Hallucination None reported or observed  Judgment Poor  Confusion None  Danger to Self  Current suicidal ideation? Denies  Danger to Others  Danger to Others None reported or observed

## 2020-06-11 NOTE — Progress Notes (Signed)

## 2020-06-11 NOTE — Progress Notes (Signed)
[facility] MD Progress Note  06/11/2020 12:47 PM DELMA VILLALVA  MRN:  [id number] Subjective: Denies medication side effects. Denies suicidal ideations. Continues to report depression and a subjective sense of hopelessness, stating that even though his wife has been supportive during phone conversations, he thinks she will leave when he returns home.  Objective: I have discussed case with treatment team and met with patient. 34 y old male, presented voluntarily reporting depression, neuro-vegetative symptoms, and passive SI. Endorses recent marital stressors related to infidelity  , stating his wife has threatened separation and taking his son . Reports past history of fleeting passive SI during episodes of severe stress. No prior psychiatric admissions and no history of suicide attempts in the past .  Was not taking any medications prior to admission. Today presents depression, with constricted affect, but denies suicidal ideations.   Presents alert, attentive, ox 3.  Presents calm, without psychomotor agitation or restlessness, without agitated or disruptive behaviors . Limited milieu participation thus far, visible in milieu, limited interaction with peers. Denies medication side effects. He was started on Remeron yesterday .  He continues to endorse depression, and ruminates on marital tension and possible separation.  He denies suicidal ideations, and states " I will be all right ". He also denies any violent or homicidal ideations towards wife .  Sleep remains fair .    Principal Problem: Depression Diagnosis: Active Problems:   Major depressive disorder, recurrent episode ([place])  Total Time spent with patient: 20 minutes  Past Psychiatric History:   Past Medical History:  Past Medical History:  Diagnosis Date  . ADHD (attention deficit hyperactivity disorder)   . Eczema   . Headache    migraine  . Inguinal hernia    left  . Wears glasses     Past Surgical History:  Procedure  Laterality Date  . INGUINAL HERNIA REPAIR Left 01/19/2020   Procedure: LEFT INGUINAL HERNIA REPAIR;  Surgeon: Ileana Roup, MD;  Location: [place];  Service: General;  Laterality: Left;  . INSERTION OF MESH Left 01/19/2020   Procedure: INSERTION OF MESH;  Surgeon: Ileana Roup, MD;  Location: [facility];  Service: General;  Laterality: Left;  . NO PAST SURGERIES  11/2019  . ORCHIOPEXY Left 01/19/2020   Procedure: LEFT ORCHIOPEXY;  Surgeon: Raynelle Bring, MD;  Location: [facility];  Service: Urology;  Laterality: Left;   Family History:  Family History  Problem Relation Age of Onset  . Depression Father   . Cancer Maternal Aunt   . Heart disease Neg Hx   . Diabetes Neg Hx   . Stroke Neg Hx    Family Psychiatric  History:  Social History:  Social History   Substance and Sexual Activity  Alcohol Use Yes   Comment: once a year     Social History   Substance and Sexual Activity  Drug Use No    Social History   Socioeconomic History  . Marital status: Married    Spouse name: Not on file  . Number of children: Not on file  . Years of education: Not on file  . Highest education level: Not on file  Occupational History  . Not on file  Tobacco Use  . Smoking status: Never Smoker  . Smokeless tobacco: Never Used  Vaping Use  . Vaping Use: Never used  Substance and Sexual Activity  . Alcohol use: Yes    Comment: once a year  . Drug use:  No  . Sexual activity: Yes    Birth control/protection: None  Other Topics Concern  . 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 Difficulty of Paying Living Expenses:   Food Insecurity:   . Worried About RCharity fundraiserin the Last Year:   . RArboriculturistin the  Last Year:   Transportation Needs:   . LFilm/video editor(Medical):   .Marland KitchenLack of Transportation (Non-Medical):   Physical Activity:   . Days of Exercise per Week:   . Minutes of Exercise per Session:   Stress:   . Feeling of Stress :   Social Connections:   . Frequency of Communication with Friends and Family:   . Frequency of Social Gatherings with Friends and Family:   . Attends Religious Services:   . Active Member of Clubs or Organizations:   . Attends CArchivistMeetings:   .Marland KitchenMarital Status:    Additional Social History:   Sleep: Fair  Appetite:  Fair  Current Medications: Current Facility-Administered Medications  Medication Dose Route Frequency Provider Last Rate Last Admin  . acetaminophen (TYLENOL) tablet 650 mg  650 mg Oral Q6H PRN NLindell SparI, NP   650 mg at 06/10/20 1420  . docusate sodium (COLACE) capsule 100 mg  100 mg Oral BID PRN Trelyn Vanderlinde, FMyer Peer MD   100 mg at 06/10/20 2133  . feeding supplement (ENSURE ENLIVE) (ENSURE ENLIVE) liquid 237 mL  237 mL Oral BID BM Nwoko, Agnes I, NP   237 mL at 06/09/20 1514  . LORazepam (ATIVAN) tablet 0.5 mg  0.5 mg Oral Q6H PRN Shanin Szymanowski, FMyer Peer MD      . mirtazapine (REMERON) tablet 15 mg  15 mg Oral QHS Jashayla Glatfelter, FMyer Peer MD        Lab Results:  Results for orders placed or performed during the hospital encounter of 06/08/20 (from the past 48 hour(s))  Basic metabolic panel     Status: Abnormal   Collection Time: 06/10/20  6:24 AM  Result Value Ref Range   Sodium 139 135 - 145 mmol/L   Potassium 3.7 3.5 - 5.1 mmol/L   Chloride 102 98 - 111 mmol/L   CO2 24 22 - 32 mmol/L   Glucose, Bld 113 (H) 70 - 99 mg/dL    Comment: Glucose reference range applies only to samples taken after fasting for at least 8 hours.   BUN 17 6 - 20 mg/dL   Creatinine, Ser 1.27 (H) 0.61 - 1.24 mg/dL   Calcium 9.0 8.9 - 10.3 mg/dL   GFR calc non Af Amer >60 >60 mL/min   GFR calc Af Amer >60 >60 mL/min   Anion gap 13 5 - 15     Comment: [facility] [address] [place] [address] Hepatic function panel     Status: Abnormal   Collection Time: 06/10/20  6:24 AM  Result Value Ref Range   Total Protein 7.5 6.5 - 8.1 g/dL   Albumin 4.4 3.5 - 5.0 g/dL   AST 17 15 - 41 U/L   ALT 20 0 - 44 U/L   Alkaline Phosphatase 60 38 - 126 U/L   Total Bilirubin 1.8 (H) 0.3 - 1.2 mg/dL   Bilirubin,  Direct 0.2 0.0 - 0.2 mg/dL   Indirect Bilirubin 1.6 (H) 0.3 - 0.9 mg/dL    Comment: Performed at [facility], [place] [address], [place], [place] [address]    Blood Alcohol level:  Lab Results  Component Value Date   ETH <10 03/54/6568    Metabolic Disorder Labs: No results found for: HGBA1C, MPG No results found for: PROLACTIN Lab Results  Component Value Date   CHOL 133 11/26/2019   TRIG 87 11/26/2019   HDL 57 11/26/2019   CHOLHDL 2.3 11/26/2019   LDLCALC 59 11/26/2019    Physical Findings: AIMS: Facial and Oral Movements Muscles of Facial Expression: None, normal Lips and Perioral Area: None, normal Jaw: None, normal Tongue: None, normal,Extremity Movements Upper (arms, wrists, hands, fingers): None, normal Lower (legs, knees, ankles, toes): None, normal, Trunk Movements Neck, shoulders, hips: None, normal, Overall Severity Severity of abnormal movements (highest score from questions above): None, normal Incapacitation due to abnormal movements: None, normal Patient's awareness of abnormal movements (rate only patient's report): No Awareness, Dental Status Current problems with teeth and/or dentures?: No Does patient usually wear dentures?: No  CIWA:    COWS:     Musculoskeletal: Strength & Muscle Tone: within normal limits Gait & Station: normal Patient leans: N/A  Psychiatric Specialty Exam: Physical Exam  Review of Systems no headache, no chest pain, no shortness of breath, no nausea, no vomiting, constipation improved - reports had BM  yesterday .  Blood pressure 115/84, pulse 76, temperature 98 F (36.7 C), temperature source Oral, resp. rate 18, height 5' 5"  (1.651 m), weight 78.5 kg, SpO2 98 %.Body mass index is 28.79 kg/m.  General Appearance: Casual  Eye Contact:  Good  Speech:  Normal Rate  Volume:  Normal  Mood:  reports mood is " same" - depressed  Affect:  vaguely constricted/dysphoric   Thought Process:  Linear and Descriptions of Associations: Intact  Orientation:  Full (Time, Place, and Person)  Thought Content:  No hallucinations, no delusions, not internally preoccupied  Suicidal Thoughts:  No he denies  suicidal or self injurious ideations. He denies HI, and specifically also denies any violent ideations towards wife.   Homicidal Thoughts:  No   Memory:  Recent and remote grossly intact  Judgement:  Other:  Improving  Insight:  Fair/improving  Psychomotor Activity:  Normal-no psychomotor agitation or restlessness  Concentration:  Concentration: Good and Attention Span: Good  Recall:  Good  Fund of Knowledge:  Good  Language:  Good  Akathisia:  Negative  Handed:  Right  AIMS (if indicated):     Assets:  Communication Skills Desire for Improvement Resilience  ADL's:  Intact  Cognition:  WNL  Sleep:  Number of Hours: 6   Assessment:  32 y old male, presented voluntarily reporting depression, neuro-vegetative symptoms, and passive SI. Endorses recent marital stressors related to infidelity  , stating his wife has threatened separation and taking his son . Reports past history of fleeing passive SI during episodes of severe stress. No prior psychiatric admissions and no history of suicide attempts in the past .  Was not taking any medications prior to admission.  Patient continues to endorse depression and characterizes mood as not significantly changed compared to admission and continues to focus on marital stressors and possible separation. He denies suicidal ideations. He reports fair sleep, but  improved on Remeron. States slept several hours last night.  He had initially declined to start an antidepressant but yesterday did agree to  Remeron trial- tolerating well thus far .     Treatment Plan Summary: Daily contact with patient to assess and evaluate symptoms and progress in treatment, Medication management, Plan Inpatient treatment and Medication as below  Treatment team working on disposition planning options Encourage group and milieu participation Continue Ativan 0.5 mg every 6 hours as needed for anxiety Increase  Remeron to 15  mg nightly for depression, insomnia Continue  Docusate 100 mg twice daily as needed for constipation  Treatment team working on disposition planning options Jenne Campus, MD 06/11/2020, 12:47 PM   Patient ID: Larey Seat, male   DOB: Nov 19, 1985, 34 y.o.   MRN: [id number]
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
## 2020-06-11 NOTE — Progress Notes (Signed)
Pt reports that he got up only once last night for about 45 minutes, but was able to fall back asleep with no problems.

## 2020-06-11 NOTE — Progress Notes (Signed)
°   06/11/20 1000  Psych Admission Type (Psych Patients Only)  Admission Status Voluntary  Psychosocial Assessment  Patient Complaints Depression;Insomnia  Eye Contact Fair  Facial Expression Flat  Affect Appropriate to circumstance;Depressed  Speech Logical/coherent  Interaction Assertive  Motor Activity Slow  Appearance/Hygiene Unremarkable  Behavior Characteristics Appropriate to situation  Mood Depressed  Thought Process  Coherency WDL  Content Blaming others  Delusions None reported or observed  Perception WDL  Hallucination None reported or observed  Judgment Poor  Confusion None  Danger to Self  Current suicidal ideation? Denies  Danger to Others  Danger to Others None reported or observed   Orders reviewed with the pt. V/s reviewed. Verbal support provided. 15 minute checks performed for safety.  Patient continues to report difficulty sleeping at night. Pt denies having nightmares or racing thoughts. Pt reports that his depression continues to be the same and rates his depression 5/10 (10 being worst). Pt compliant with taking meds and denies any side effects.

## 2020-06-12 MED ORDER — TRAZODONE HCL 50 MG PO TABS
50.0000 mg | ORAL_TABLET | Freq: Every evening | ORAL | Status: DC | PRN
  Administered 2020-06-12: 50 mg via ORAL
  Filled 2020-06-12: qty 1

## 2020-06-12 MED ORDER — ENSURE ENLIVE PO LIQD: 237.0000 mL | ORAL | Status: DC

## 2020-06-12 MED ORDER — MIRTAZAPINE 30 MG PO TABS
30.0000 mg | ORAL_TABLET | Freq: Every day | ORAL | Status: DC
  Administered 2020-06-12 – 2020-06-13 (×2): 30 mg via ORAL
  Filled 2020-06-12 (×4): qty 1

## 2020-06-12 MED ORDER — ADULT MULTIVITAMIN W/MINERALS CH
1.0000 | ORAL_TABLET | Freq: Every day | ORAL | Status: DC
  Filled 2020-06-12 (×5): qty 1

## 2020-06-12 NOTE — Progress Notes (Signed)
NUTRITION ASSESSMENT RD working remotely.  Pt identified as at risk on the Malnutrition Screen Tool  INTERVENTION: - continue Ensure Enlive but will decrease from BID to once/day., each supplement provides 350 kcal and 20 grams of protein. - will order 1 tablet multivitamin with minerals/day.   NUTRITION DIAGNOSIS: Unintentional weight loss related to sub-optimal intake as evidenced by pt report.   Goal: Pt to meet >/= 90% of their estimated nutrition needs.  Monitor:  PO intake  Assessment:  Patient admitted for depression and passive SI. Notes indicate patient had reported marital stressors which has lead to passive SI and severe stress.   Ensure Enlive was ordered BID on 8/5 and patient has been accepting this supplement ~50% of the time offered.  Weight on 8/5 was 173 lb and weight on 8/3 was 175 lb. Prior to that, most recently documented weight was on 01/19/20 when he weighed 187 lb. This indicates 14 lb weight loss (7.5% body weight) in 5 months; not significant for time frame.   34 y.o. male  Height: Ht Readings from Last 1 Encounters:  06/08/20 5\' 5"  (1.651 m)    Weight: Wt Readings from Last 1 Encounters:  06/08/20 78.5 kg    Weight Hx: Wt Readings from Last 10 Encounters:  06/08/20 78.5 kg  06/06/20 79.4 kg  01/19/20 85 kg  12/20/19 84.1 kg  11/23/19 81.6 kg  01/13/18 77.1 kg  12/21/14 76.2 kg  10/19/12 68 kg    BMI:  Body mass index is 28.79 kg/m. Pt meets criteria for overweight status based on current BMI.  Estimated Nutritional Needs: Kcal: 25-30 kcal/kg Protein: > 1 gram protein/kg Fluid: 1 ml/kcal  Diet Order:  Diet Order            Diet heart healthy/carb modified Room service appropriate? Yes; Fluid consistency: Thin  Diet effective now                Pt is also offered choice of unit snacks mid-morning and mid-afternoon.  Pt is eating as desired.   Lab results and medications reviewed.      10/21/12, MS, RD, LDN,  CNSC Inpatient Clinical Dietitian RD pager # available in [facility]  After hours/weekend pager # available in [facility]

## 2020-06-12 NOTE — Progress Notes (Signed)
Recreation Therapy Notes  Date:  8.9.21 Time: 0930 Location: 300 Hall Dayroom  Group Topic: Stress Management  Goal Area(s) Addresses:  Patient will identify positive stress management techniques. Patient will identify benefits of using stress management post d/c.  Behavioral Response: Engaged  Intervention: Stress Management  Activity: Meditation.  LRT played a meditation that focused on having gratitude for people in your life who are supportive.  Patients were to listen and follow along as meditation played to engage in activity.   Education:  Stress Management, Discharge Planning.   Education Outcome: Acknowledges Education  Clinical Observations/Feedback: Pt attended and participate in activity.    Caroll Rancher, LRT/CTRS    Caroll Rancher A 06/12/2020 10:42 AM

## 2020-06-12 NOTE — [facility] Group Notes (Signed)
Occupational Therapy Group Note Date: 06/12/2020 Group Topic/Focus: Life Skills/Feelings Management  Group Description: Group encouraged increased participation and engagement through discussion/worksheet focused on "Breaking Down Barriers". Patients were encouraged to complete a worksheet that identified barriers to seeking treatment, asking for help, and engaging in mental health services. Discussion focused on first identifying these barriers and then brainstorming strategies to confront/challenge identified barriers.  Participation Level: Active   Participation Quality: Independent   Behavior: Calm, Cooperative and Interactive   Speech/Thought Process: Directed and Focused   Affect/Mood: Euthymic   Insight: Moderate   Judgement: Moderate   Individualization: Alinda Money was active and independent in his participation of discussion and completing worksheet. Pt identified "stuffing feelings" and "not trusting others" as the biggest barriers to his mental health treatment, and acknowledged "I work so that I don't have to acknowledge how I feel." He shared that he works three jobs and does not trust other people's opinions of what he should do (for tx) because "they've never been in this position." Pt also shared that he never learned about mental health and did not know that 'places like this exist' for treatment/support/help.  Modes of Intervention: Activity, Discussion and Education  Patient Response to Interventions:  Attentive, Engaged, Receptive and Interested   Plan: Continue to engage patient in OT groups 2 - 3x/week.  Donne Hazel, MOT, OTR/L

## 2020-06-12 NOTE — Progress Notes (Signed)
Adult Psychoeducational Group Note  Date:  06/12/2020 Time:  9:01 PM  Group Topic/Focus:  Wrap-Up Group:   The focus of this group is to help patients review their daily goal of treatment and discuss progress on daily workbooks.  Participation Level:  Active  Participation Quality:  Appropriate  Affect:  Appropriate  Cognitive:  Appropriate  Insight: Appropriate   Engagement in Group:  Engaged  Modes of Intervention:  Discussion  Additional Comments:  Pt attend wrap up group. His day was a 6. His goal was to be more open and honest. Pt said he achieved his goal. His coping skills breathing technique.  Charna Busman Long 06/12/2020, 9:01 PM

## 2020-06-12 NOTE — Tx Team (Cosign Needed)
cobInterdisciplinary Treatment and Diagnostic Plan Update  06/12/2020 Time of Session: 9:05am JAC ROMULUS MRN: [id number]  Principal Diagnosis: <principal problem not specified>  Secondary Diagnoses: Active Problems:   Major depressive disorder, recurrent episode (HCC)   Current Medications:  Current Facility-Administered Medications  Medication Dose Route Frequency Provider Last Rate Last Admin  . acetaminophen (TYLENOL) tablet 650 mg  650 mg Oral Q6H PRN Armandina Stammer I, NP   650 mg at 06/10/20 1420  . docusate sodium (COLACE) capsule 100 mg  100 mg Oral BID PRN Cobos, Rockey Situ, MD   100 mg at 06/10/20 2133  . feeding supplement (ENSURE ENLIVE) (ENSURE ENLIVE) liquid 237 mL  237 mL Oral Q24H Cobos, Fernando A, MD      . LORazepam (ATIVAN) tablet 0.5 mg  0.5 mg Oral Q6H PRN Cobos, Rockey Situ, MD   0.5 mg at 06/11/20 2142  . mirtazapine (REMERON) tablet 15 mg  15 mg Oral QHS Cobos, Rockey Situ, MD   15 mg at 06/11/20 2142  . multivitamin with minerals tablet 1 tablet  1 tablet Oral Daily Cobos, Rockey Situ, MD       PTA Medications: Medications Prior to Admission  Medication Sig Dispense Refill Last Dose  . ibuprofen (ADVIL,MOTRIN) 600 MG tablet Take 1 tablet (600 mg total) by mouth every 6 (six) hours as needed. (Patient not taking: Reported on 06/07/2020) 30 tablet 0     Patient Stressors: Financial difficulties Marital or family conflict Occupational concerns Traumatic event  Patient Strengths: Ability for insight Average or above average intelligence Capable of independent living Motivation for treatment/growth Physical Health Supportive family/friends Work skills  Treatment Modalities: Medication Management, Group therapy, Case management,  1 to 1 session with clinician, Psychoeducation, Recreational therapy.   Physician Treatment Plan for Primary Diagnosis: <principal problem not specified> Long Term Goal(s): Improvement in symptoms so as ready for  discharge Improvement in symptoms so as ready for discharge   Short Term Goals: Ability to identify changes in lifestyle to reduce recurrence of condition will improve Ability to verbalize feelings will improve Ability to disclose and discuss suicidal ideas Ability to demonstrate self-control will improve Ability to identify and develop effective coping behaviors will improve Ability to maintain clinical measurements within normal limits will improve Compliance with prescribed medications will improve Ability to identify changes in lifestyle to reduce recurrence of condition will improve Ability to verbalize feelings will improve Ability to disclose and discuss suicidal ideas Ability to demonstrate self-control will improve Ability to identify and develop effective coping behaviors will improve Ability to maintain clinical measurements within normal limits will improve  Medication Management: Evaluate patient's response, side effects, and tolerance of medication regimen.  Therapeutic Interventions: 1 to 1 sessions, Unit Group sessions and Medication administration.  Evaluation of Outcomes: Progressing  Physician Treatment Plan for Secondary Diagnosis: Active Problems:   Major depressive disorder, recurrent episode (HCC)  Long Term Goal(s): Improvement in symptoms so as ready for discharge Improvement in symptoms so as ready for discharge   Short Term Goals: Ability to identify changes in lifestyle to reduce recurrence of condition will improve Ability to verbalize feelings will improve Ability to disclose and discuss suicidal ideas Ability to demonstrate self-control will improve Ability to identify and develop effective coping behaviors will improve Ability to maintain clinical measurements within normal limits will improve Compliance with prescribed medications will improve Ability to identify changes in lifestyle to reduce recurrence of condition will improve Ability to  verbalize feelings will improve Ability to  disclose and discuss suicidal ideas Ability to demonstrate self-control will improve Ability to identify and develop effective coping behaviors will improve Ability to maintain clinical measurements within normal limits will improve     Medication Management: Evaluate patient's response, side effects, and tolerance of medication regimen.  Therapeutic Interventions: 1 to 1 sessions, Unit Group sessions and Medication administration.  Evaluation of Outcomes: Progressing   RN Treatment Plan for Primary Diagnosis: <principal problem not specified> Long Term Goal(s): Knowledge of disease and therapeutic regimen to maintain health will improve  Short Term Goals: Ability to remain free from injury will improve, Ability to verbalize feelings will improve, Ability to disclose and discuss suicidal ideas and Compliance with prescribed medications will improve  Medication Management: RN will administer medications as ordered by provider, will assess and evaluate patient's response and provide education to patient for prescribed medication. RN will report any adverse and/or side effects to prescribing provider.  Therapeutic Interventions: 1 on 1 counseling sessions, Psychoeducation, Medication administration, Evaluate responses to treatment, Monitor vital signs and CBGs as ordered, Perform/monitor CIWA, COWS, AIMS and Fall Risk screenings as ordered, Perform wound care treatments as ordered.  Evaluation of Outcomes: Progressing   LCSW Treatment Plan for Primary Diagnosis: <principal problem not specified> Long Term Goal(s): Safe transition to appropriate next level of care at discharge, Engage patient in therapeutic group addressing interpersonal concerns.  Short Term Goals: Engage patient in aftercare planning with referrals and resources, Increase social support, Increase emotional regulation, Identify triggers associated with mental health/substance abuse  issues and Increase skills for wellness and recovery  Therapeutic Interventions: Assess for all discharge needs, 1 to 1 time with Social worker, Explore available resources and support systems, Assess for adequacy in community support network, Educate family and significant other(s) on suicide prevention, Complete Psychosocial Assessment, Interpersonal group therapy.  Evaluation of Outcomes: Progressing   Progress in Treatment: Attending groups: Yes. Participating in groups: Yes. Taking medication as prescribed: Yes. Toleration medication: Yes. Family/Significant other contact made: Yes, individual(s) contacted:  wife. Patient understands diagnosis: Yes. Discussing patient identified problems/goals with staff: Yes. Medical problems stabilized or resolved: Yes. Denies suicidal/homicidal ideation: Yes. Issues/concerns per patient self-inventory: No.   New problem(s) identified: No, Describe:  none.  New Short Term/Long Term Goal(s): medication stabilization, elimination of SI thoughts, development of comprehensive mental wellness plan.   Patient Goals:  Did not attend.   Discharge Plan or Barriers: None noted.  Reason for Continuation of Hospitalization: Depression Medication stabilization  Estimated Length of Stay: 1-3 days  Attendees: Patient: Did not attend 06/12/2020   Physician:  06/12/2020  Nursing:  06/12/2020  RN Care Manager: 06/12/2020   Social Worker: Ruthann Cancer, LCSW 06/12/2020  Recreational Therapist:  06/12/2020   Other:  06/12/2020   Other:  06/12/2020   Other: 06/12/2020       Scribe for Treatment Team: Otelia Santee, LCSW 06/12/2020 2:43 PM

## 2020-06-12 NOTE — Progress Notes (Signed)
[facility] MD Progress Note  06/12/2020 2:43 PM Victor Stevens  MRN:  [id number] Subjective:  "I'm in the middle."  Victor Stevens found sitting in the dayroom. He presents with euthymic affect. He states mood is unchanged from admission but minimizes SI on admission. He reports passive SI- "if I got stuck on a railroad track, I wouldn't try to get off. But I'm not trying to get stuck on a railroad track." Denies suicidal plan or intent. He states hearing other patient's problems has helped him feel grateful for things that are going well in his life.   His wife called this afternoon and suggested the patient stay with his parents in [place] for a while after discharge, to help her heal from his recent infidelity. He states he is still able to return home but is undecided where he will be going, especially with his son and current employment being local. He admits to feeling anxious about this but appears future-oriented at this time. He reports sleep has been fair with Remeron. 6.5 hours of sleep recorded. He denies HI/AVH. He has been active and visible in the milieu.  From admission H&P:  34 year old male, presented to ED on 8/3 reporting having suicidal ideations following an argument with his wife. Explains he called his general  manager about this situation and  "I guess I said I no longer cared about life ", and was advised to go to ED. He states this occurred soon after having an argument with his wife. Explains that  his wife  recently found out he was being unfaithful, resulting in arguments / threats of separation and of her taking their son with her.  Principal Problem: <principal problem not specified> Diagnosis: Active Problems:   Major depressive disorder, recurrent episode (HCC)  Total Time spent with patient: 15 minutes  Past Psychiatric History: See admission H&P  Past Medical History:  Past Medical History:  Diagnosis Date  . ADHD (attention deficit hyperactivity disorder)   . Eczema    . Headache    migraine  . Inguinal hernia    left  . Wears glasses     Past Surgical History:  Procedure Laterality Date  . INGUINAL HERNIA REPAIR Left 01/19/2020   Procedure: LEFT INGUINAL HERNIA REPAIR;  Surgeon: Andria Meuse, MD;  Location: [facility];  Service: General;  Laterality: Left;  . INSERTION OF MESH Left 01/19/2020   Procedure: INSERTION OF MESH;  Surgeon: Andria Meuse, MD;  Location: [facility];  Service: General;  Laterality: Left;  . NO PAST SURGERIES  11/2019  . ORCHIOPEXY Left 01/19/2020   Procedure: LEFT ORCHIOPEXY;  Surgeon: Heloise Purpura, MD;  Location: [facility];  Service: Urology;  Laterality: Left;   Family History:  Family History  Problem Relation Age of Onset  . Depression Father   . Cancer Maternal Aunt   . Heart disease Neg Hx   . Diabetes Neg Hx   . Stroke Neg Hx    Family Psychiatric  History: See admission H&P Social History:  Social History   Substance and Sexual Activity  Alcohol Use Yes   Comment: once a year     Social History   Substance and Sexual Activity  Drug Use No    Social History   Socioeconomic History  . Marital status: Married    Spouse name: Not on file  . Number of children: Not on file  . Years of education: Not on file  .  Highest education level: Not on file  Occupational History  . Not on file  Tobacco Use  . Smoking status: Never Smoker  . Smokeless tobacco: Never Used  Vaping Use  . Vaping Use: Never used  Substance and Sexual Activity  . Alcohol use: Yes    Comment: once a year  . Drug use: No  . Sexual activity: Yes    Birth control/protection: None  Other Topics Concern  . 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 Difficulty  of Paying Living Expenses:   Food Insecurity:   . Worried About Programme researcher, broadcasting/film/video in the Last Year:   . Barista in the Last Year:   Transportation Needs:   . Freight forwarder (Medical):   Marland Kitchen Lack of Transportation (Non-Medical):   Physical Activity:   . Days of Exercise per Week:   . Minutes of Exercise per Session:   Stress:   . Feeling of Stress :   Social Connections:   . Frequency of Communication with Friends and Family:   . Frequency of Social Gatherings with Friends and Family:   . Attends Religious Services:   . Active Member of Clubs or Organizations:   . Attends Banker Meetings:   Marland Kitchen Marital Status:    Additional Social History:                         Sleep: Fair  Appetite:  Good  Current Medications: Current Facility-Administered Medications  Medication Dose Route Frequency Provider Last Rate Last Admin  . acetaminophen (TYLENOL) tablet 650 mg  650 mg Oral Q6H PRN Armandina Stammer I, NP   650 mg at 06/10/20 1420  . docusate sodium (COLACE) capsule 100 mg  100 mg Oral BID PRN Cobos, Rockey Situ, MD   100 mg at 06/10/20 2133  . feeding supplement (ENSURE ENLIVE) (ENSURE ENLIVE) liquid 237 mL  237 mL Oral Q24H Cobos, Fernando A, MD      . LORazepam (ATIVAN) tablet 0.5 mg  0.5 mg Oral Q6H PRN Cobos, Rockey Situ, MD   0.5 mg at 06/11/20 2142  . mirtazapine (REMERON) tablet 15 mg  15 mg Oral QHS Cobos, Rockey Situ, MD   15 mg at 06/11/20 2142  . multivitamin with minerals tablet 1 tablet  1 tablet Oral Daily Cobos, Rockey Situ, MD        Lab Results: No results found for this or any previous visit (from the past 48 hour(s)).  Blood Alcohol level:  Lab Results  Component Value Date   ETH <10 06/07/2020    Metabolic Disorder Labs: No results found for: HGBA1C, MPG No results found for: PROLACTIN Lab Results  Component Value Date   CHOL 133 11/26/2019   TRIG 87 11/26/2019   HDL 57 11/26/2019   CHOLHDL 2.3 11/26/2019   LDLCALC 59  11/26/2019    Physical Findings: AIMS: Facial and Oral Movements Muscles of Facial Expression: None, normal Lips and Perioral Area: None, normal Jaw: None, normal Tongue: None, normal,Extremity Movements Upper (arms, wrists, hands, fingers): None, normal Lower (legs, knees, ankles, toes): None, normal, Trunk Movements Neck, shoulders, hips: None, normal, Overall Severity Severity of abnormal movements (highest score from questions above): None, normal Incapacitation due to abnormal movements:  None, normal Patient's awareness of abnormal movements (rate only patient's report): No Awareness, Dental Status Current problems with teeth and/or dentures?: No Does patient usually wear dentures?: No  CIWA:    COWS:     Musculoskeletal: Strength & Muscle Tone: within normal limits Gait & Station: normal Patient leans: N/A  Psychiatric Specialty Exam: Physical Exam Vitals and nursing note reviewed.  Constitutional:      Appearance: He is well-developed.  Cardiovascular:     Rate and Rhythm: Normal rate.  Pulmonary:     Effort: Pulmonary effort is normal.  Neurological:     Mental Status: He is alert and oriented to person, place, and time.     Review of Systems  Constitutional: Negative.   Respiratory: Negative for cough and shortness of breath.   Psychiatric/Behavioral: Negative for agitation, behavioral problems, decreased concentration, dysphoric mood, hallucinations, self-injury, sleep disturbance and suicidal ideas. The patient is nervous/anxious. The patient is not hyperactive.     Blood pressure (!) 122/92, pulse 78, temperature 97.8 F (36.6 C), temperature source Oral, resp. rate 18, height 5\' 5"  (1.651 m), weight 78.5 kg, SpO2 98 %.Body mass index is 28.79 kg/m.  General Appearance: Casual  Eye Contact:  Good  Speech:  Normal Rate  Volume:  Normal  Mood:  Anxious  Affect:  Congruent  Thought Process:  Coherent  Orientation:  Full (Time, Place, and Person)  Thought  Content:  Logical  Suicidal Thoughts:  Yes.  without intent/plan  Homicidal Thoughts:  No  Memory:  Immediate;   Fair Recent;   Fair  Judgement:  Intact  Insight:  Fair  Psychomotor Activity:  Normal  Concentration:  Concentration: Fair and Attention Span: Fair  Recall:  of Knowledge:  Fair  Language:  Good  Akathisia:  No  Handed:  Right  AIMS (if indicated):     Assets:  Communication Skills Desire for Improvement Resilience Social Support  ADL's:  Intact  Cognition:  WNL  Sleep:  Number of Hours: 6.5     Treatment Plan Summary: Daily contact with patient to assess and evaluate symptoms and progress in treatment and Medication management   Continue inpatient hospitalization.  Increase Remeron to 30 mg PO QHS for depression/anxiety Continue Ativan 0.5 mg PO Q6HR PRN anxiety  Patient will participate in the therapeutic group milieu.  Discharge disposition in progress.   [employer], NP 06/12/2020, 2:43 PM

## 2020-06-12 NOTE — Progress Notes (Signed)
   06/12/20 0545  Vital Signs  Pulse Rate 78  BP (!) 122/92  BP Location Right Arm  BP Method Automatic  Patient Position (if appropriate) Standing   D: Patient denies SI/HI/ AVH. Patient rated hopelessness 6/10.  Patient was out in open areas. A: Patient refused his morning vitamin.  Support and encouragement provided Routine safety checks conducted every 15 minutes. Patient  Informed to notify staff with any concerns.   R:  Safety maintained.

## 2020-06-13 MED ORDER — TRAZODONE HCL 100 MG PO TABS
100.0000 mg | ORAL_TABLET | Freq: Every evening | ORAL | Status: DC | PRN
  Administered 2020-06-13: 100 mg via ORAL
  Filled 2020-06-13: qty 1

## 2020-06-13 NOTE — BHH Counselor (Signed)
CSW met with pt to discuss disposition. Pt confirms that he cannot return home with wife. States he is waiting to hear from HR to see if his job can be transferred to [place] where he would live with his parents. Pt states his parents could come get him on 06/15/20 if he were to go there. CSW inquired about alternative plans if pt were discharged sooner than 06/15/20. Pt is vague and is unable to give a direct answer. He states "I don't know where I'd go" but also states "I'd find a place." Pt reports that he has a brother in the area but is not willing to ask brother for help as brother is in small apartment with family. Pt states he will hear back from his company HR to hear if his job can be transferred to [place].

## 2020-06-13 NOTE — Progress Notes (Signed)
   06/13/20 0639  Vital Signs  Pulse Rate 79  BP 114/83  BP Location Left Arm  BP Method Automatic  Patient Position (if appropriate) Standing   D: Patient denies SI/HI/AVH. Patient rated anxiety 3/10 and depression 4/10. Patient out in open areas. A:   Support and encouragement provided Routine safety checks conducted every 15 minutes. Patient  Informed to notify staff with any concerns.   R: Safety maintained.A:

## 2020-06-13 NOTE — Progress Notes (Signed)
Recreation Therapy Notes  Animal-Assisted Activity (AAA) Program Checklist/Progress Notes Patient Eligibility Criteria Checklist & Daily Group note for Rec Tx Intervention  Date: 8.10.21 Time: 1430 Location: [address]   AAA/T Program Assumption of Risk Form signed by Engineer, production or Parent Legal Guardian  YES  Patient is free of allergies or sever asthma  YES  Patient reports no fear of animals  YES  Patient reports no history of cruelty to animals YES   Patient understands his/her participation is voluntary YES   Patient washes hands before animal contact  YES   Patient washes hands after animal contact  YES  Behavioral Response: Engaged  Education: Charity fundraiser, Appropriate Animal Interaction   Education Outcome: Acknowledges understanding/In group clarification offered/Needs additional education.   Clinical Observations/Feedback: Pt attended and participated in activity.     Caroll Rancher, LRT/CTRS     Lillia Abed, Judson Tsan A 06/13/2020 3:21 PM

## 2020-06-13 NOTE — Progress Notes (Signed)
   06/13/20 2048  Psych Admission Type (Psych Patients Only)  Admission Status Voluntary  Psychosocial Assessment  Patient Complaints Depression  Eye Contact Fair  Facial Expression Flat  Affect Appropriate to circumstance  Speech Logical/coherent  Interaction Assertive  Motor Activity Other (Comment) (wnl)  Appearance/Hygiene Unremarkable  Behavior Characteristics Cooperative  Mood Depressed  Thought Process  Coherency WDL  Content WDL  Delusions None reported or observed  Perception WDL  Hallucination None reported or observed  Judgment Poor  Confusion None  Danger to Self  Current suicidal ideation? Denies  Danger to Others  Danger to Others None reported or observed   Rates depression 6/10.

## 2020-06-13 NOTE — Progress Notes (Signed)
Adult Psychoeducational Group Note  Date:  06/13/2020 Time:  10:41 PM  Group Topic/Focus:  Wrap-Up Group:   The focus of this group is to help patients review their daily goal of treatment and discuss progress on daily workbooks.  Participation Level:  Minimal  Participation Quality:  Appropriate  Affect:  Blunted  Cognitive:  Lacking  Insight: Good  Engagement in Group:  Lacking  Modes of Intervention:  Education and Support  Additional Comments:  Pt. attended the group but did not have anything to add the group. Pt. Was not fully engaged.  Tad Fancher  Lanice Shirts 06/13/2020, 10:41 PM

## 2020-06-13 NOTE — Progress Notes (Signed)
With patient's expressed consent, I spoke with his wife Brenton Grills [phone]. Ms. Laural Benes confirms that patient will be moving to [place] with his parents, but states that he is able to stay in their home here for a few nights until transportation to [place] can be arranged. She denies safety concerns for discharge.

## 2020-06-13 NOTE — Progress Notes (Signed)
[facility] MD Progress Note  06/13/2020 10:17 AM Victor Stevens  MRN:  [id number] Subjective:  "I'm ok."  Mr. Melone found sitting in the dayroom. He presents with euthymic affect and reports mood is "ok." He is going to be calling back an HR representative with his company after 11am to see if his job can be transitioned to [place]. He plans to discharge to his parents' home in [place] if his job can be transitioned there. His wife is stating she wants him to stay there for several months. He reports disappointment and anxiety regarding the unknown outcome for their marriage, as well as with being away from their son. He denies SI/HI and expresses motivation to work on Pharmacologist for the situation. He continues to report difficulty sleeping but feels this is related to being in the hospital and in a room close to noise from the nurses station. 6.5 hours of sleep recorded overnight. He provides verbal consent for his wife to be contacted for collateral information and states she should be available for a phone call after 2:30pm.  From admission H&P: 34 year old male, presented to ED on 8/3 reporting having suicidal ideations following an argument with his wife. Explains he called his general manager about this situation and "I guess I said I no longer cared about life ", and was advised to go to ED. He states this occurred soon after having an argument with his wife. Explains that his wife recently found out he was being unfaithful, resulting in arguments / threats of separation and ofher taking their son with her.  Principal Problem: <principal problem not specified> Diagnosis: Active Problems:   Major depressive disorder, recurrent episode (HCC)  Total Time spent with patient: 15 minutes  Past Psychiatric History: See admission H&P  Past Medical History:  Past Medical History:  Diagnosis Date  . ADHD (attention deficit hyperactivity disorder)   . Eczema   . Headache    migraine  .  Inguinal hernia    left  . Wears glasses     Past Surgical History:  Procedure Laterality Date  . INGUINAL HERNIA REPAIR Left 01/19/2020   Procedure: LEFT INGUINAL HERNIA REPAIR;  Surgeon: Andria Meuse, MD;  Location: [facility];  Service: General;  Laterality: Left;  . INSERTION OF MESH Left 01/19/2020   Procedure: INSERTION OF MESH;  Surgeon: Andria Meuse, MD;  Location: [facility];  Service: General;  Laterality: Left;  . NO PAST SURGERIES  11/2019  . ORCHIOPEXY Left 01/19/2020   Procedure: LEFT ORCHIOPEXY;  Surgeon: Heloise Purpura, MD;  Location: [facility];  Service: Urology;  Laterality: Left;   Family History:  Family History  Problem Relation Age of Onset  . Depression Father   . Cancer Maternal Aunt   . Heart disease Neg Hx   . Diabetes Neg Hx   . Stroke Neg Hx    Family Psychiatric  History: See admission H&P Social History:  Social History   Substance and Sexual Activity  Alcohol Use Yes   Comment: once a year     Social History   Substance and Sexual Activity  Drug Use No    Social History   Socioeconomic History  . Marital status: Married    Spouse name: Not on file  . Number of children: Not on file  . Years of education: Not on file  . Highest education level: Not on file  Occupational History  . Not on file  Tobacco Use  . Smoking status: Never Smoker  . Smokeless tobacco: Never Used  Vaping Use  . Vaping Use: Never used  Substance and Sexual Activity  . Alcohol use: Yes    Comment: once a year  . Drug use: No  . Sexual activity: Yes    Birth control/protection: None  Other Topics Concern  . 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 Difficulty of Paying Living Expenses:    Food Insecurity:   . Worried About Programme researcher, broadcasting/film/video in the Last Year:   . Barista in the Last Year:   Transportation Needs:   . Freight forwarder (Medical):   Marland Kitchen Lack of Transportation (Non-Medical):   Physical Activity:   . Days of Exercise per Week:   . Minutes of Exercise per Session:   Stress:   . Feeling of Stress :   Social Connections:   . Frequency of Communication with Friends and Family:   . Frequency of Social Gatherings with Friends and Family:   . Attends Religious Services:   . Active Member of Clubs or Organizations:   . Attends Banker Meetings:   Marland Kitchen Marital Status:    Additional Social History:                         Sleep: Good  Appetite:  Good  Current Medications: Current Facility-Administered Medications  Medication Dose Route Frequency Provider Last Rate Last Admin  . acetaminophen (TYLENOL) tablet 650 mg  650 mg Oral Q6H PRN Armandina Stammer I, NP   650 mg at 06/10/20 1420  . docusate sodium (COLACE) capsule 100 mg  100 mg Oral BID PRN Cobos, Rockey Situ, MD   100 mg at 06/10/20 2133  . feeding supplement (ENSURE ENLIVE) (ENSURE ENLIVE) liquid 237 mL  237 mL Oral Q24H Cobos, Fernando A, MD      . LORazepam (ATIVAN) tablet 0.5 mg  0.5 mg Oral Q6H PRN Cobos, Rockey Situ, MD   0.5 mg at 06/11/20 2142  . mirtazapine (REMERON) tablet 30 mg  30 mg Oral QHS Aldean Baker, NP   30 mg at 06/12/20 2117  . multivitamin with minerals tablet 1 tablet  1 tablet Oral Daily Cobos, Rockey Situ, MD      . traZODone (DESYREL) tablet 100 mg  100 mg Oral QHS PRN Aldean Baker, NP        Lab Results: No results found for this or any previous visit (from the past 48 hour(s)).  Blood Alcohol level:  Lab Results  Component Value Date   ETH <10 06/07/2020    Metabolic Disorder Labs: No results found for: HGBA1C, MPG No results found for: PROLACTIN Lab Results  Component Value Date   CHOL 133 11/26/2019   TRIG 87 11/26/2019   HDL 57  11/26/2019   CHOLHDL 2.3 11/26/2019   LDLCALC 59 11/26/2019    Physical Findings: AIMS: Facial and Oral Movements Muscles of Facial Expression: None, normal Lips and Perioral Area: None, normal Jaw: None, normal Tongue: None, normal,Extremity Movements Upper (arms, wrists, hands, fingers): None, normal Lower (legs, knees, ankles, toes): None, normal, Trunk Movements Neck, shoulders, hips: None, normal, Overall Severity Severity of abnormal movements (highest score from questions above): None,  normal Incapacitation due to abnormal movements: None, normal Patient's awareness of abnormal movements (rate only patient's report): No Awareness, Dental Status Current problems with teeth and/or dentures?: No Does patient usually wear dentures?: No  CIWA:    COWS:     Musculoskeletal: Strength & Muscle Tone: within normal limits Gait & Station: normal Patient leans: N/A  Psychiatric Specialty Exam: Physical Exam Vitals and nursing note reviewed.  Constitutional:      Appearance: He is well-developed.  Cardiovascular:     Rate and Rhythm: Normal rate.  Pulmonary:     Effort: Pulmonary effort is normal.  Neurological:     Mental Status: He is alert and oriented to person, place, and time.     Review of Systems  Constitutional: Negative.   Respiratory: Negative for cough and shortness of breath.   Psychiatric/Behavioral: Positive for sleep disturbance. Negative for agitation, behavioral problems, confusion, dysphoric mood, hallucinations, self-injury and suicidal ideas. The patient is nervous/anxious. The patient is not hyperactive.     Blood pressure 114/83, pulse 79, temperature 97.8 F (36.6 C), temperature source Oral, resp. rate 18, height 5\' 5"  (1.651 m), weight 78.5 kg, SpO2 98 %.Body mass index is 28.79 kg/m.  General Appearance: Casual  Eye Contact:  Good  Speech:  Normal Rate  Volume:  Normal  Mood:  Anxious  Affect:  Constricted  Thought Process:  Coherent and Goal  Directed  Orientation:  Full (Time, Place, and Person)  Thought Content:  Logical  Suicidal Thoughts:  No  Homicidal Thoughts:  No  Memory:  Immediate;   Good Recent;   Good  Judgement:  Intact  Insight:  Fair  Psychomotor Activity:  Normal  Concentration:  Concentration: Fair and Attention Span: Fair  Recall:  Good  Fund of Knowledge:  Fair  Language:  Good  Akathisia:  No  Handed:  Right  AIMS (if indicated):     Assets:  Communication Skills Desire for Improvement Housing Social Support  ADL's:  Intact  Cognition:  WNL  Sleep:  Number of Hours: 6.5     Treatment Plan Summary: Daily contact with patient to assess and evaluate symptoms and progress in treatment and Medication management   Continue inpatient hospitalization.  Continue Remeron 30 mg PO QHS for depression/anxiety Increase trazodone to 100 mg PO QHS PRN insomnia Continue Ativan 0.5 mg PO Q6HR PRN anxiety  Patient will participate in the therapeutic group milieu.  Discharge disposition in progress.   , NP 06/13/2020, 10:17 AM

## 2020-06-14 MED ORDER — MIRTAZAPINE 30 MG PO TABS: 30.0000 mg | ORAL_TABLET | Freq: Every day | ORAL | 0 refills | Status: AC

## 2020-06-14 MED ORDER — DOCUSATE SODIUM 100 MG PO CAPS: 100.0000 mg | ORAL_CAPSULE | Freq: Two times a day (BID) | ORAL | 0 refills | Status: AC | PRN

## 2020-06-14 MED ORDER — TRAZODONE HCL 100 MG PO TABS: 100.0000 mg | ORAL_TABLET | Freq: Every evening | ORAL | 0 refills | Status: AC | PRN

## 2020-06-14 MED ORDER — MIRTAZAPINE 30 MG PO TABS: 30.0000 mg | ORAL_TABLET | Freq: Every day | ORAL | 0 refills | Status: DC

## 2020-06-14 NOTE — [facility] Suicide Risk Assessment (Signed)
[facility] Discharge Suicide Risk Assessment   Principal Problem: <principal problem not specified> Discharge Diagnoses: Active Problems:   Major depressive disorder, recurrent episode (HCC)   Total Time spent with patient: 15 minutes  Musculoskeletal: Strength & Muscle Tone: within normal limits Gait & Station: normal Patient leans: N/A  Psychiatric Specialty Exam: Review of Systems  All other systems reviewed and are negative.   Blood pressure 123/79, pulse 96, temperature 98.2 F (36.8 C), temperature source Oral, resp. rate 18, height 5\' 5"  (1.651 m), weight 78.5 kg, SpO2 98 %.Body mass index is 28.79 kg/m.  General Appearance: Casual  Eye Contact::  Fair  Speech:  Normal Rate409  Volume:  Decreased  Mood:  Euthymic  Affect:  Congruent  Thought Process:  Coherent and Descriptions of Associations: Intact  Orientation:  Full (Time, Place, and Person)  Thought Content:  Logical  Suicidal Thoughts:  No  Homicidal Thoughts:  No  Memory:  Immediate;   Fair Recent;   Fair Remote;   Fair  Judgement:  Intact  Insight:  Fair  Psychomotor Activity:  Decreased  Concentration:  Fair  Recall:  [id number] of Knowledge:Fair  Language: Good  Akathisia:  Negative  Handed:  Right  AIMS (if indicated):     Assets:  Desire for Improvement Housing Resilience  Sleep:  Number of Hours: 6.5  Cognition: WNL  ADL's:  Intact   Mental Status Per Nursing Assessment::   On Admission:  Self-harm thoughts  Demographic Factors:  Male  Loss Factors: Loss of significant relationship  Historical Factors: Impulsivity  Risk Reduction Factors:   Living with another person, especially a relative  Continued Clinical Symptoms:  Depression:   Impulsivity  Cognitive Features That Contribute To Risk:  None    Suicide Risk:  Minimal: No identifiable suicidal ideation.  Patients presenting with no risk factors but with morbid ruminations; may be classified as minimal risk based on the  severity of the depressive symptoms   Follow-up Information    [phone] Go on 06/16/2020.   Why: You have a hospital discharge appointment on 06/16/20 at 9:30 am  for medication management and therapy services.  This appointment will be held in person Contact information: [address] [place] [phone]               Plan Of Care/Follow-up recommendations:  Activity:  ad lib  [phone], MD 06/14/2020, 9:04 AM

## 2020-06-14 NOTE — Progress Notes (Signed)
Discharge Home:  Patient discharged with wife to their home.  Patient denied SI and HI.  Denied A/V hallucinations.  Suicide prevention information given and discussed with patient who stated he understood and had no questions.  Patient stated he received all his belongings, clothing, toiletries, misc items, etc.  Patient stated he appreciated all assistance received from [facility] staff.  All required discharge information given to patient at discharge.

## 2020-06-14 NOTE — Progress Notes (Signed)
D:  Patient's self inventory sheet, patient has poor sleep, sleep medication not helpful.  Fair appetite, low energy level, poor concentration.  Rated depression 6, hopeless 5, anxiety 2.  Denied withdrawals.  Denied SI.  Physical problems, headaches.  Worst pain #5, pain medicine helpful.  Goal is work on depression.  Attend groups.  Does have discharge plans. A:  Medications administered per MD orders. Emotional support and encouragement given patient. R:  Patient denied SI and HI, contracts for safety.  Denied A/V hallucinations.  Safety maintained with 15 minute checks.

## 2020-06-14 NOTE — Progress Notes (Signed)
  [facility] Adult Case Management Discharge Plan :  Will you be returning to the same living situation after discharge:  Yes,  home with wife and then will be going to [place] to live with parents.  At discharge, do you have transportation home?: Yes,  Wife Do you have the ability to pay for your medications: Yes,  BCBS  Release of information consent forms completed and in the chart;  Patient's signature needed at discharge.  Patient to Follow up at:  Follow-up Information    [facility] Go on 06/16/2020.   Why: You have a hospital discharge appointment on 06/16/20 at 9:30 am  for medication management and therapy services.  This appointment will be held in person Contact information: [address] [place] [address] [phone]        [facility]. Schedule an appointment as soon as possible for a visit.   Why: Offers therapy and medication management. Call to make an appointment  Contact information: [address] [place], [place], [place] [address] Phone: [phone]       [facility]. Schedule an appointment as soon as possible for a visit.   Why: Offers psychiatric care/medication management.  Contact information: [id number] [place], [place] [address] [phone]       [facility] Psychiat[facility]. Schedule an appointment as soon as possible for a visit.   Why: Offers medication management and outpatient therapy.  Contact information: [address] [place], [place] [address] [phone]              Next level of care provider has access to [facility] Link:no  Safety Planning and Suicide Prevention discussed: Yes,  with wife     Has patient been referred to the Quitline?: N/A patient is not a smoker  Patient has been referred for addiction treatment: N/A  Erin Sons, LCSW 06/14/2020, 11:28 AM

## 2020-06-14 NOTE — Discharge Summary (Signed)
Physician Discharge Summary Note  Patient:  Victor Stevens is an 34 y.o., male  MRN:  [id number]  DOB:  [birth year]  Patient phone:  [phone] (home)   Patient address:   [address] [place] [address],   Total Time spent with patient: Greater than 30 minutes  Date of Admission:  06/08/2020  Date of Discharge: 06-14-20  Reason for Admission: Suicidal ideations triggered by an argument with wife.  Principal Problem: Severe episode of recurrent major depressive disorder, without psychotic features [facility])  Discharge Diagnoses: Principal Problem:   Severe episode of recurrent major depressive disorder, without psychotic features (HCC) Active Problems:   Major depressive disorder, recurrent episode (HCC)  Past Psychiatric History: Major depressive disorder.  Past Medical History:  Past Medical History:  Diagnosis Date  . ADHD (attention deficit hyperactivity disorder)   . Eczema   . Headache    migraine  . Inguinal hernia    left  . Wears glasses     Past Surgical History:  Procedure Laterality Date  . INGUINAL HERNIA REPAIR Left 01/19/2020   Procedure: LEFT INGUINAL HERNIA REPAIR;  Surgeon: Andria Meuse, MD;  Location: [facility];  Service: General;  Laterality: Left;  . INSERTION OF MESH Left 01/19/2020   Procedure: INSERTION OF MESH;  Surgeon: Andria Meuse, MD;  Location: [facility];  Service: General;  Laterality: Left;  . NO PAST SURGERIES  11/2019  . ORCHIOPEXY Left 01/19/2020   Procedure: LEFT ORCHIOPEXY;  Surgeon: Heloise Purpura, MD;  Location: [facility];  Service: Urology;  Laterality: Left;   Family History:  Family History  Problem Relation Age of Onset  . Depression Father   . Cancer Maternal Aunt   . Heart disease Neg Hx   . Diabetes Neg Hx   . Stroke Neg Hx    Family Psychiatric  History: See H&P.  Social History:  Social History   Substance and Sexual Activity   Alcohol Use Yes   Comment: once a year     Social History   Substance and Sexual Activity  Drug Use No    Social History   Socioeconomic History  . Marital status: Married    Spouse name: Not on file  . Number of children: Not on file  . Years of education: Not on file  . Highest education level: Not on file  Occupational History  . Not on file  Tobacco Use  . Smoking status: Never Smoker  . Smokeless tobacco: Never Used  Vaping Use  . Vaping Use: Never used  Substance and Sexual Activity  . Alcohol use: Yes    Comment: once a year  . Drug use: No  . Sexual activity: Yes    Birth control/protection: None  Other Topics Concern  . 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 Difficulty of Paying Living Expenses:   Food Insecurity:   . Worried About Programme researcher, broadcasting/film/video in the Last Year:   . Barista in the Last Year:   Transportation Needs:   . Freight forwarder (Medical):   Marland Kitchen Lack of Transportation (Non-Medical):   Physical Activity:   . Days of Exercise per Week:   . Minutes of Exercise per Session:   Stress:   .  Feeling of Stress :   Social Connections:   . Frequency of Communication with Friends and Family:   . Frequency of Social Gatherings with Friends and Family:   . Attends Religious Services:   . Active Member of Clubs or Organizations:   . Attends BankerClub or Organization Meetings:   Marland Kitchen. Marital Status:    Hospital Course: (Per Md's admission evaluation notes): 34 year old male, presented to ED on 8/3 reporting having suicidal ideations following an argument with his wife. Explains he called his general  manager about this situation and  "I guess I said I no longer cared about life ", and was advised  to go to ED. He states this occurred soon  after having an argument with his wife . Explains that  his wife  recently found out he was being unfaithful, resulting in arguments / threats of separation and of  her taking their son with her. He also reports other stressors include stressful job and financial difficulties. States that prior to this event " I was doing all right". He reports he had some anxiety, "stress" due to his job, but states he was not feeling particularly depressed. He states that occasionally during episodes of increased stress he would have fleeting passive SI " like thinking I wish I were not alive", but denies having any suicidal plan or intention.  He does endorse some neuro-vegetative symptoms of depression, as below, which he states started following marital discord.  After the above admission evaluation, Victor Stevens's presenting symptoms were noted. He was recommended for mood stabilization treatments. The medication regimen targeting those presenting symptoms were discussed with him & initiated with his consent. He was medicated, stabilized & discharged on the medications as listed on his discharge medication lists below. Besides the mood stabilization treatments, Victor Stevens was also enrolled & participated in the group counseling sessions being offered & held on this unit. He learned coping skills. He also presented other mild pre-existing medical issues (constiaption) that required treatment. He was treated & discharged on all his pertinent home medications for those health issues. He tolerated his treatment regimen without any adverse effects or reactions reported.  During the course of his hospitalization, the 15-minute checks were adequate to ensure Victor Stevens's safety.  Patient did not display any dangerous, violent or suicidal behavior on the unit.  He interacted with patients & staff appropriately. He participated appropriately in the group sessions/therapies. His medications were addressed & adjusted to meet his needs. He was  recommended for outpatient follow-up care & medication management upon discharge to assure continuity of care & mood stability.  At the time of discharge patient is not reporting any acute symptoms. He feels more confident about his self-care & in managing his mental health. He currently denies any new issues or concerns. Education and supportive counseling provided throughout his hospital stay & upon discharge.  Today upon his discharge evaluation with the attending psychiatrist, Victor Stevens shares he is doing well. He denies any other specific concerns. He is sleeping well. His appetite is good. He denies other physical complaints. He denies AH/VH. he feels that his medications have been helpful & is in agreement to continue his current treatment regimen. He was able to engage in safety planning including plan to [facility] or contact emergency services if he feels unable to maintain his own safety or the safety of others. Pt had no further questions, comments, or concerns. [facility] in no apparent distress. Transportation per  his wife.  Physical Findings: AIMS: Facial and Oral Movements Muscles of Facial Expression: None, normal Lips and Perioral Area: None, normal Jaw: None, normal Tongue: None, normal,Extremity Movements Upper (arms, wrists, hands, fingers): None, normal Lower (legs, knees, ankles, toes): None, normal, Trunk Movements Neck, shoulders, hips: None, normal, Overall Severity Severity of abnormal movements (highest score from questions above): None, normal Incapacitation due to abnormal movements: None, normal Patient's awareness of abnormal movements (rate only patient's report): No Awareness, Dental Status Current problems with teeth and/or dentures?: No Does patient usually wear dentures?: No  CIWA:    COWS:     Musculoskeletal: Strength & Muscle Tone: within normal limits Gait & Station: normal Patient leans: N/A  Psychiatric Specialty  Exam: Physical Exam Vitals and nursing note reviewed.  HENT:     Head: Normocephalic.     Nose: Nose normal.     Mouth/Throat:     Pharynx: Oropharynx is clear.  Eyes:     Pupils: Pupils are equal, round, and reactive to light.  Cardiovascular:     Rate and Rhythm: Normal rate.     Pulses: Normal pulses.  Pulmonary:     Effort: Pulmonary effort is normal.  Genitourinary:    Comments: Deferred Musculoskeletal:        General: Normal range of motion.     Cervical back: Normal range of motion.  Skin:    General: Skin is warm and dry.  Neurological:     Mental Status: He is alert and oriented to person, place, and time.     Review of Systems  Constitutional: Negative for chills, diaphoresis and fever.  HENT: Negative for congestion, rhinorrhea, sneezing and sore throat.   Eyes: Negative for discharge.  Respiratory: Negative for cough, chest tightness, shortness of breath and wheezing.   Cardiovascular: Negative for chest pain and palpitations.  Gastrointestinal: Negative for diarrhea, nausea and vomiting.  Endocrine: Negative for cold intolerance.  Genitourinary: Negative for difficulty urinating.  Musculoskeletal: Negative for arthralgias and myalgias.  Skin: Negative.   Allergic/Immunologic: Positive for food allergies ([place]). Negative for environmental allergies and immunocompromised state.       Allergies: NKDA  Neurological: Negative for dizziness, tremors, seizures, syncope, speech difficulty, light-headedness, numbness and headaches.  Psychiatric/Behavioral: Positive for dysphoric mood (Stabilized with medication prior to discharge) and sleep disturbance (Stabilized with medication prior to discharge). Negative for agitation, behavioral problems, confusion, decreased concentration, hallucinations, self-injury and suicidal ideas. The patient is not nervous/anxious (Stable upon discharge) and is not hyperactive.     Blood pressure 123/79, pulse 96, temperature 98.2 F  (36.8 C), temperature source Oral, resp. rate 18, height 5\' 5"  (1.651 m), weight 78.5 kg, SpO2 98 %.Body mass index is 28.79 kg/m.  See Md's discharge SRA  Sleep:  Number of Hours: 6.5   Has this patient used any form of tobacco in the last 30 days? (Cigarettes, Smokeless Tobacco, Cigars, and/or Pipes): N/A  Blood Alcohol level:  Lab Results  Component Value Date   ETH <10 06/07/2020   Metabolic Disorder Labs:  No results found for: HGBA1C, MPG No results found for: PROLACTIN Lab Results  Component Value Date   CHOL 133 11/26/2019   TRIG 87 11/26/2019   HDL 57 11/26/2019   CHOLHDL 2.3 11/26/2019   LDLCALC 59 11/26/2019   See Psychiatric Specialty Exam and Suicide Risk Assessment completed by Attending Physician prior to discharge.  Discharge destination:  Home  Is patient on multiple antipsychotic therapies at discharge:  No   Has  Patient had three or more failed trials of antipsychotic monotherapy by history:  No  Recommended Plan for Multiple Antipsychotic Therapies: NA  Allergies as of 06/14/2020      Reactions   Other Other (See Comments)   Seafood makes me nauseous      Medication List    STOP taking these medications   ibuprofen 600 MG tablet Commonly known as: ADVIL     TAKE these medications     Indication  docusate sodium 100 MG capsule Commonly known as: COLACE Take 1 capsule (100 mg total) by mouth 2 (two) times daily as needed. (May buy from over the counter): For constipation  Indication: Constipation   mirtazapine 30 MG tablet Commonly known as: REMERON Take 1 tablet (30 mg total) by mouth at bedtime. For depression  Indication: Major Depressive Disorder   traZODone 100 MG tablet Commonly known as: DESYREL Take 1 tablet (100 mg total) by mouth at bedtime as needed for sleep.  Indication: Trouble Sleeping       Follow-up Information    [facility] Go on 06/16/2020.   Why: You have a hospital discharge appointment on 06/16/20  at 9:30 am  for medication management and therapy services.  This appointment will be held in person Contact information: [address] [place] [address] [phone]              Follow-up recommendations: Activity:  As tolerated Diet: As recommended by your primary care doctor. Keep all scheduled follow-up appointments as recommended.   Comments: Prescriptions given at discharge.  Patient agreeable to plan.  Given opportunity to ask questions.  Appears to feel comfortable with discharge denies any current suicidal or homicidal thought. Patient is also instructed prior to discharge to: Take all medications as prescribed by his/her mental healthcare provider. Report any adverse effects and or reactions from the medicines to his/her outpatient provider promptly. Patient has been instructed & cautioned: To not engage in alcohol and or illegal drug use while on prescription medicines. In the event of worsening symptoms, patient is instructed to call the crisis hotline, 911 and or go to the nearest ED for appropriate evaluation and treatment of symptoms. To follow-up with his/her primary care provider for your other medical issues, concerns and or health care needs.  Signed: Armandina Stammer, NP, PMHNP, FNP-BC 06/14/2020, 9:38 AM

## 2020-06-16 DIAGNOSIS — F4320 Adjustment disorder, unspecified: Secondary | ICD-10-CM | POA: Diagnosis not present

## 2020-06-20 ENCOUNTER — Encounter: Payer: Self-pay | Admitting: Medical

## 2020-06-20 ENCOUNTER — Other Ambulatory Visit: Payer: Self-pay

## 2020-06-20 ENCOUNTER — Ambulatory Visit: Payer: BC Managed Care – PPO | Admitting: Medical

## 2020-06-20 VITALS — BP 132/82 | HR 90 | Ht 65.0 in | Wt 185.0 lb

## 2020-06-20 DIAGNOSIS — R4586 Emotional lability: Secondary | ICD-10-CM

## 2020-06-20 DIAGNOSIS — R7309 Other abnormal glucose: Secondary | ICD-10-CM

## 2020-06-20 DIAGNOSIS — Z113 Encounter for screening for infections with a predominantly sexual mode of transmission: Secondary | ICD-10-CM

## 2020-06-20 DIAGNOSIS — Z630 Problems in relationship with spouse or partner: Secondary | ICD-10-CM | POA: Diagnosis not present

## 2020-06-20 DIAGNOSIS — Z131 Encounter for screening for diabetes mellitus: Secondary | ICD-10-CM | POA: Diagnosis not present

## 2020-06-20 DIAGNOSIS — R7989 Other specified abnormal findings of blood chemistry: Secondary | ICD-10-CM

## 2020-06-20 LAB — HEMOGLOBIN A1C
Est. average glucose Bld gHb Est-mCnc: 91 mg/dL
Hgb A1c MFr Bld: 4.8 % (ref 4.8–5.6)

## 2020-06-20 NOTE — Progress Notes (Signed)
Subjective: Chief Complaint  Patient presents with  . Labs Only    std check and potassium    Here for labs.    Wanted STD panel.   No particular symptoms.  He notes a different partner 7 or so months ago but no symptoms.    He is married.   Been having some stress in marriage, stress at work, just hit the 1 year mark with his marriage.     Went to emergency dept last week.  potassium was low.    Advised to f/u with PCP regarding potassium.  He had went to emergency dept that day after feeling fed up with life and stress.   Was seen for suicidal ideation although he denies he would committ suicide.  He has no plan, but does have a firearm in his car, conceal carry permit.  He plans to go stay with parents in [place] for a few months.   He is planning to establish with counseling.    No other aggravating or relieving factors. No other complaint.    Past Medical History:  Diagnosis Date  . ADHD (attention deficit hyperactivity disorder)   . Eczema   . Headache    migraine  . Inguinal hernia    left  . Wears glasses    ROS as in subjective    Objective: BP 132/82   Pulse 90   Ht 5\' 5"  (1.651 m)   Wt 185 lb (83.9 kg)   SpO2 97%   BMI 30.79 kg/m   Gen: wd, wn, nad Psych: pleasant, somewhat poor eye contact, somewhat anxious, but answers questions somewhat succinct     Assessment: Encounter Diagnoses  Name Primary?  . Mood change Yes  . Marital conflict   . Screen for STD (sexually transmitted disease)   . Screening for diabetes mellitus   . Elevated glucose   . Elevated serum creatinine       Plan Mood change, marital conflict - advised counseling.  I reviewed his ED visit from last week.  He denies SI/HI.  He does have a handgun, but denies plan.   He notes that his reply to ED was he wasn't fed up with things but not planning to commit suicide.  He plans to go stay with parents in for the next month - 3 months.  I encouraged counseling given  marriage only a year old, job [place] stress.   STD screen at his request today  He had elevated glucose on some recent labs.  Add HgbA1C today  Elevated creatinine recently likely transient.  advised good hydration, avoid NSAIDs, and plan to repeat Creatinine in a month.    Taran was seen today for labs only.  Diagnoses and all orders for this visit:  Mood change  Marital conflict  Screen for STD (sexually transmitted disease) -     GC/Chlamydia Probe Amp -     HIV Antibody (routine testing w rflx) -     RPR -     Hepatitis C antibody -     Hepatitis B surface antigen  Screening for diabetes mellitus  Elevated glucose -     Hemoglobin A1c  Elevated serum creatinine  f/u pending labs

## 2020-06-21 LAB — RPR: RPR Ser Ql: NONREACTIVE

## 2020-06-21 LAB — GC/CHLAMYDIA PROBE AMP
Chlamydia trachomatis, NAA: NEGATIVE
Neisseria Gonorrhoeae by PCR: NEGATIVE

## 2020-06-21 LAB — HIV ANTIBODY (ROUTINE TESTING W REFLEX): HIV Screen 4th Generation wRfx: NONREACTIVE

## 2020-06-21 LAB — HEPATITIS B SURFACE ANTIGEN: Hepatitis B Surface Ag: NEGATIVE

## 2020-06-21 LAB — HEPATITIS C ANTIBODY: Hep C Virus Ab: 0.2 s/co ratio (ref 0.0–0.9)

## 2021-10-11 ENCOUNTER — Telehealth: Payer: Self-pay | Admitting: Medical

## 2021-10-11 ENCOUNTER — Encounter: Payer: BC Managed Care – PPO | Admitting: Medical

## 2021-10-11 DIAGNOSIS — Z0000 Encounter for general adult medical examination without abnormal findings: Secondary | ICD-10-CM | POA: Insufficient documentation

## 2021-10-11 NOTE — Progress Notes (Deleted)
Depression Std screen? Flu covid Sleep?

## 2021-10-11 NOTE — Telephone Encounter (Signed)

## 2021-10-16 ENCOUNTER — Encounter: Payer: Self-pay | Admitting: Medical

## 2022-07-10 ENCOUNTER — Encounter: Payer: Self-pay | Admitting: Internal Medicine

## 2022-08-13 ENCOUNTER — Encounter: Payer: Self-pay | Admitting: Internal Medicine

## 2024-10-08 ENCOUNTER — Encounter (HOSPITAL_BASED_OUTPATIENT_CLINIC_OR_DEPARTMENT_OTHER): Payer: Self-pay | Admitting: Surgery
